# Patient Record
Sex: Female | Born: 1935 | Race: White | Hispanic: No | Marital: Married | Smoking: Former smoker
Health system: Southern US, Community
[De-identification: ages and names within clinical notes are randomized; demographics above are authoritative.]

## PROBLEM LIST (undated history)

## (undated) DIAGNOSIS — I10 Essential (primary) hypertension: Secondary | ICD-10-CM

## (undated) DIAGNOSIS — I639 Cerebral infarction, unspecified: Secondary | ICD-10-CM

## (undated) DIAGNOSIS — D329 Benign neoplasm of meninges, unspecified: Secondary | ICD-10-CM

## (undated) DIAGNOSIS — E119 Type 2 diabetes mellitus without complications: Secondary | ICD-10-CM

## (undated) DIAGNOSIS — D649 Anemia, unspecified: Secondary | ICD-10-CM

## (undated) DIAGNOSIS — E785 Hyperlipidemia, unspecified: Secondary | ICD-10-CM

## (undated) HISTORY — PX: COLONOSCOPY: SHX174

## (undated) HISTORY — PX: BREAST EXCISIONAL BIOPSY: SUR124

## (undated) HISTORY — PX: CHOLECYSTECTOMY: SHX55

## (undated) HISTORY — PX: ABDOMINAL HYSTERECTOMY: SHX81

---

## 2004-09-17 ENCOUNTER — Ambulatory Visit: Payer: Self-pay | Admitting: Internal Medicine

## 2005-02-17 ENCOUNTER — Encounter: Payer: Self-pay | Admitting: Internal Medicine

## 2005-02-25 ENCOUNTER — Encounter: Payer: Self-pay | Admitting: Internal Medicine

## 2005-08-17 ENCOUNTER — Ambulatory Visit: Payer: Self-pay | Admitting: Internal Medicine

## 2005-10-21 ENCOUNTER — Ambulatory Visit: Payer: Self-pay | Admitting: Internal Medicine

## 2006-04-25 ENCOUNTER — Ambulatory Visit: Payer: Self-pay | Admitting: Unknown Physician Specialty

## 2006-10-25 ENCOUNTER — Ambulatory Visit: Payer: Self-pay | Admitting: Internal Medicine

## 2007-11-07 ENCOUNTER — Ambulatory Visit: Payer: Self-pay | Admitting: Internal Medicine

## 2008-11-08 ENCOUNTER — Ambulatory Visit: Payer: Self-pay | Admitting: Internal Medicine

## 2009-11-17 ENCOUNTER — Ambulatory Visit: Payer: Self-pay | Admitting: Internal Medicine

## 2010-11-18 ENCOUNTER — Ambulatory Visit: Payer: Self-pay | Admitting: Internal Medicine

## 2011-04-27 ENCOUNTER — Ambulatory Visit: Payer: Self-pay | Admitting: Unknown Physician Specialty

## 2011-12-17 ENCOUNTER — Ambulatory Visit: Payer: Self-pay | Admitting: Internal Medicine

## 2012-12-18 ENCOUNTER — Ambulatory Visit: Payer: Self-pay | Admitting: Internal Medicine

## 2013-01-24 ENCOUNTER — Inpatient Hospital Stay: Payer: Self-pay | Admitting: Orthopedic Surgery

## 2013-01-24 LAB — CBC
MCHC: 33.2 g/dL (ref 32.0–36.0)
MCV: 86 fL (ref 80–100)
RBC: 4.33 10*6/uL (ref 3.80–5.20)
WBC: 13.1 10*3/uL — ABNORMAL HIGH (ref 3.6–11.0)

## 2013-01-24 LAB — URINALYSIS, COMPLETE
Bilirubin,UR: NEGATIVE
Glucose,UR: NEGATIVE mg/dL (ref 0–75)
Nitrite: NEGATIVE
Ph: 5 (ref 4.5–8.0)
Protein: NEGATIVE
WBC UR: 3 /HPF (ref 0–5)

## 2013-01-24 LAB — BASIC METABOLIC PANEL
Anion Gap: 7 (ref 7–16)
Calcium, Total: 9.7 mg/dL (ref 8.5–10.1)
Chloride: 107 mmol/L (ref 98–107)
Creatinine: 0.92 mg/dL (ref 0.60–1.30)
Glucose: 123 mg/dL — ABNORMAL HIGH (ref 65–99)
Osmolality: 287 (ref 275–301)
Potassium: 3.7 mmol/L (ref 3.5–5.1)

## 2013-01-24 LAB — APTT: Activated PTT: 29 secs (ref 23.6–35.9)

## 2013-01-24 LAB — PROTIME-INR
INR: 0.9
Prothrombin Time: 12.6 secs (ref 11.5–14.7)

## 2013-01-25 LAB — HEMOGLOBIN: HGB: 10.6 g/dL — ABNORMAL LOW (ref 12.0–16.0)

## 2013-01-26 ENCOUNTER — Encounter: Payer: Self-pay | Admitting: Internal Medicine

## 2013-01-26 LAB — BASIC METABOLIC PANEL
Anion Gap: 6 — ABNORMAL LOW (ref 7–16)
BUN: 11 mg/dL (ref 7–18)
Calcium, Total: 7.9 mg/dL — ABNORMAL LOW (ref 8.5–10.1)
Osmolality: 281 (ref 275–301)
Potassium: 3.6 mmol/L (ref 3.5–5.1)
Sodium: 140 mmol/L (ref 136–145)

## 2013-01-26 LAB — CBC WITH DIFFERENTIAL/PLATELET
Basophil #: 0 10*3/uL (ref 0.0–0.1)
Basophil %: 0.1 %
Eosinophil %: 0 %
HGB: 9.9 g/dL — ABNORMAL LOW (ref 12.0–16.0)
Lymphocyte #: 1 10*3/uL (ref 1.0–3.6)
MCH: 28.7 pg (ref 26.0–34.0)
MCHC: 33.9 g/dL (ref 32.0–36.0)
MCV: 85 fL (ref 80–100)
Platelet: 144 10*3/uL — ABNORMAL LOW (ref 150–440)
RBC: 3.46 10*6/uL — ABNORMAL LOW (ref 3.80–5.20)
WBC: 11.2 10*3/uL — ABNORMAL HIGH (ref 3.6–11.0)

## 2013-03-15 ENCOUNTER — Encounter: Payer: Self-pay | Admitting: Orthopedic Surgery

## 2013-03-27 ENCOUNTER — Encounter: Payer: Self-pay | Admitting: Orthopedic Surgery

## 2013-04-27 ENCOUNTER — Encounter: Payer: Self-pay | Admitting: Orthopedic Surgery

## 2013-05-28 ENCOUNTER — Encounter: Payer: Self-pay | Admitting: Orthopedic Surgery

## 2013-10-02 ENCOUNTER — Ambulatory Visit: Payer: Self-pay | Admitting: Orthopedic Surgery

## 2013-12-19 ENCOUNTER — Ambulatory Visit: Payer: Self-pay | Admitting: Internal Medicine

## 2014-07-03 ENCOUNTER — Encounter: Payer: Self-pay | Admitting: Neurology

## 2014-07-28 ENCOUNTER — Encounter: Payer: Self-pay | Admitting: Neurology

## 2014-12-30 ENCOUNTER — Ambulatory Visit
Admit: 2014-12-30 | Disposition: A | Discharge status: Home / Self Care | Payer: Self-pay | Attending: Internal Medicine | Admitting: Internal Medicine

## 2015-01-01 ENCOUNTER — Ambulatory Visit
Admit: 2015-01-01 | Disposition: A | Discharge status: Home / Self Care | Payer: Self-pay | Attending: Internal Medicine | Admitting: Internal Medicine

## 2015-01-17 NOTE — Discharge Summary (Signed)
PATIENT NAME:  TIERRE, GERARD MR#:  [id number] DATE OF BIRTH:  [birth year]  DATE OF ADMISSION:  01/26/2013 DATE OF DISCHARGE:  01/27/2013  ADMITTING DIAGNOSIS:  Left bimalleolar ankle fracture.  HISTORY OF PRESENT ILLNESS:  The patient is a 80 year old female who slipped on her wet front porch and fell twisting her left ankle. She was brought to the [facility] Emergency Department where she was diagnosed with a bimalleolar ankle fracture by x-ray and admitted to the orthopedic surgery service.   PAST MEDICAL HISTORY:  Includes hypertension and left ankle fracture.   ALLERGIES:  No known drug allergies.  HOME MEDICATIONS:  Include 81 mg aspirin daily, magnesium chloride extended release 250 mg once daily, Tiazac 300 mg extended release oral capsule 1 caplet daily, multivitamin daily, Evista 60 mg daily and Micardis 40 mg daily.   SOCIAL HISTORY:  The patient lives at home.   FAMILY HISTORY:  Noncontributory.   HOSPITAL COURSE: The patient was admitted on 01/25/2013. She was cleared by the hospitalist medical service for surgery. She was taken to the operating room later on 34/19/3790 for an uncomplicated open reduction and internal fixation of the left bimalleolar ankle fracture. The patient returned to the orthopedic floor postop. She had a Foley catheter in place which was removed on postop day #2.  She received 24 hours of postop antibiotics and was seen and evaluated by physical and occupational therapy on postop day #1.  Her postop CBC demonstrated stable hemoglobin and hematocrit.  Her basic metabolic profile was within normal limits. The patient's pain was well controlled. She remained on strict elevation during bed rest. She was encouraged to improve her breathing with incentive spirometry. By postop day #2, the Foley catheter was removed. Her splint remained clean, dry and intact. The patient had no medical issues and was prepared for discharge to rehab.   DISCHARGE  INSTRUCTIONS: The patient was discharged to rehab with instructions to remain touchdown weight-bearing on her left lower extremity.  She should elevate the lower extremity whenever possible. She is to stay in the splint until her follow-up and she should keep the splint dry at all times. The patient will use a walker with assistance with ambulation. She should receive physical and occupational therapy at rehab. The patient will return to my office in 10 to 14 days for wound check and x-ray. She will be converted to a cast at that time. The patient should remain on Lovenox 30 mg subcutaneous b.i.d. while at skilled nursing. When she is discharged home, she should be taking 40 mg daily of Lovenox subcutaneous for a total of 4 weeks postoperative.  DISCHARGE PHYSICAL EXAMINATION: The patient's dressing was clean, dry and intact. There is no active drainage. She could flex and extend all 5 toes and her toes were well perfused. She had decreased sensation in the left toes which is her baseline, but she had normal sensation on the dorsum of her foot and the first dorsal web space.    ____________________________ Timoteo Gaul, MD klk:ce D: 01/26/2013 17:04:49 ET T: 01/26/2013 17:26:43 ET JOB#: [id number]  cc: Timoteo Gaul, MD, <Dictator>

## 2015-01-17 NOTE — Op Note (Signed)
PATIENT NAME:  Natasha Bentley, GROSS MR#:  [id number] DATE OF BIRTH:  Jul 27, 1935  DATE OF SURGERY:  01/25/2013  PREOPERATIVE DIAGNOSIS: Left bimalleolar ankle fracture.   POSTOPERATIVE DIAGNOSIS: Left bimalleolar ankle fracture.   PROCEDURE: Open reduction, internal fixation, left bimalleolar ankle fracture.   ANESTHESIA: General with local, with 0.25% Marcaine without epi.   SURGEON: Thornton Park, M.D.   ESTIMATED BLOOD LOSS: 25 mL.   COMPLICATIONS: None.   TOURNIQUET TIME: 90 minutes.   IMPLANTS: Synthes 8-hole, one-third tubular plate and two 4.0 cannulated screws.   HISTORY: Ms Axelson is a 80 year old female who sustained a mechanical fall at home. She was unable to stand or bear weight following the injury. X-rays taken at the [facility] Emergency Department diagnosed her with a bimalleolar ankle fracture. The patient was seen by the hospitalist service and cleared for surgery. I saw and evaluated the patient and recommended open reduction and internal fixation. I explained the risks and benefits of surgery to her. The patient wished to proceed with surgery.   PROCEDURE NOTE: The patient was marked with the word "yes" over the left leg within the operative field according to the hospital's right-side protocol. She was brought to the operating room where she underwent general anesthesia. She was prepped and draped in sterile fashion.   A time-out was performed to verify the patient's name, date of birth, medical record number, correct side of surgery, and correct procedure to be performed. It was also used to verify the patient had received antibiotics and that all appropriate instruments, implants, and radiographic studies were available in the room. Once all in attendance were in agreement, the case began.   FluoroScan images were initially taken of the fracture to help for planning of the incisions. The lateral incision along the fibula was then made with a #10 blade  centered over the fracture. The soft tissues were carefully dissected with Metzenbaum scissors and a pick-up. Care was taken to avoid the superficial peroneal nerve. The fracture was then identified. The fracture hematoma was irrigated. The overlying soft tissue was cleared from the fracture site with a Key elevator. The fracture was then manually reduced and held into place with a fracture clamp. A single  2.5 mm bicortical lag screw was then placed across the fracture site. This screw was 20 mm in length. Next, an 8-hole one-third tubular Synthes plate was then contoured to the lateral malleolus. This was then fixed to the fibula using proximal 3.5 mm bicortical screws and distally fixed the lateral malleolus with fully-threaded cancellous screws. Once the fibula was fixed the attention was turned to the medial side.   A vertical incision was made over the medial malleolus. Carefully again, the soft tissues were dissected with Metzenbaum scissors and a pick-up. The fracture was identified and cleaned, and irrigated of fracture hematoma. They joint was inspected and showed no evidence of chondral injury. The fracture was reduced and held into place with a dental pick. Two threaded K-wires were then advanced across the fracture site and into the distal tibia. The positions of these were confirmed with FluoroScan imaging. They were measured for depth and then overdrilled.   One 4-0 cannulated screw x 50 millimeters in length, and one 48 mm in length were then manually advanced into position. These compressed the medial malleolus fracture well. Both incisions were then copiously irrigated. Final arthroscopic images were taken. Stress views of the ankle were also performed to ensure that there was no instability of syndesmosis. The  subcutaneous tissue of both incisions were closed with a 2-0 Vicryl and the skin approximated with staples. A dry sterile dressing and an AO splint were then applied. The patient was  then awakened and brought to the PACU in stable condition.   I was scrubbed and present for the entire case, and all sharp and instrument counts were correct at the conclusion of the case.   I spoke with the patient's family postoperatively to let them know the patient was stable in the recovery room and that the case had gone without complication.    ____________________________ Timoteo Gaul, MD klk:dm D: 02/01/2013 09:33:00 ET T: 02/01/2013 09:47:39 ET JOB#: [id number]  cc: Timoteo Gaul, MD, <Dictator> Timoteo Gaul MD ELECTRONICALLY SIGNED 02/02/2013 17:08

## 2015-01-17 NOTE — H&P (Signed)
Subjective/Chief Complaint Left ankle pain   History of Present Illness Patient is a 80 year old female who slipped on her wet porch last night and fell.  She twisted her left ankle and had her left leg flex underneath her during the fall.  She denies head injury or any other sources of pain.  Her family is at the bedside this AM.  Patient was seen and examined at 9:30AM.   Past Med/Surgical Hx:  FX  LEFT ANKLE:   Hypertension:   ALLERGIES:  No Known Allergies:   HOME MEDICATIONS: Medication Instructions Status  aspirin 81 mg oral delayed release tablet 1 tab(s) orally once a day Active  magnesium chloride extended release 250 milligram(s)  once a day Active  Tiazac 300 mg/24 hours oral capsule, extended release 1 cap(s) orally once a day Active  Vitamins Multiple Vitamins oral tablet 1 tab(s) orally once a day Active  Evista 60 mg oral tablet 1 tab(s) orally once a day Active  Micardis 40 mg oral tablet 1 tab(s) orally once a day Active   Family and Social History:  Family History Non-Contributory   Place of Living Home   Review of Systems:  Subjective/Chief Complaint Left ankle pain   Physical Exam:  GEN no acute distress   HEENT PERRL, hearing intact to voice, moist oral mucosa, Oropharynx clear   NECK supple  No masses  trachea midline   RESP normal resp effort  clear BS  no use of accessory muscles   CARD regular rate  no murmur  No LE edema  no JVD   ABD denies tenderness  soft  normal BS  no Adominal Mass   GU foley catheter in place  clear yellow urine draining   LYMPH negative neck   EXTR Left leg in splint.  Toes are well perfused.  She has intact sensation in the left lower extremity and can flex and extend all toes.   SKIN normal to palpation   NEURO motor/sensory function intact   PSYCH A+O to time, place, person   Lab Results: Routine Chem:  30-Apr-14 22:14   Glucose, Serum  123  Creatinine (comp) 0.92  Sodium, Serum 142  Potassium, Serum  3.7  Chloride, Serum 107  CO2, Serum 28  Calcium (Total), Serum 9.7  Anion Gap 7  Osmolality (calc) 287  eGFR (African American) >60  eGFR (Non-African American) >60 (eGFR values <38m/min/1.73 m2 may be an indication of chronic kidney disease (CKD). Calculated eGFR is useful in patients with stable renal function. The eGFR calculation will not be reliable in acutely ill patients when serum creatinine is changing rapidly. It is not useful in  patients on dialysis. The eGFR calculation may not be applicable to patients at the low and high extremes of body sizes, pregnant women, and vegetarians.)  Routine UA:  30-Apr-14 22:14   Color (UA) Yellow  Clarity (UA) Clear  Glucose (UA) Negative  Bilirubin (UA) Negative  Ketones (UA) Negative  Specific Gravity (UA) 1.020  Blood (UA) Negative  pH (UA) 5.0  Protein (UA) Negative  Nitrite (UA) Negative  Leukocyte Esterase (UA) Trace (Result(s) reported on 24 Jan 2013 at 11:25PM.)  RBC (UA) 2 /HPF  WBC (UA) 3 /HPF  Bacteria (UA) TRACE  Epithelial Cells (UA) 1 /HPF  Mucous (UA) PRESENT (Result(s) reported on 24 Jan 2013 at 11:25PM.)  Routine Coag:  30-Apr-14 22:14   Prothrombin 12.6  INR 0.9 (INR reference interval applies to patients on anticoagulant therapy. A single INR  therapeutic range for coumarins is not optimal for all indications; however, the suggested range for most indications is 2.0 - 3.0. Exceptions to the INR Reference Range may include: Prosthetic heart valves, acute myocardial infarction, prevention of myocardial infarction, and combinations of aspirin and anticoagulant. The need for a higher or lower target INR must be assessed individually. Reference: The Pharmacology and Management of the Vitamin K  antagonists: the seventh ACCP Conference on Antithrombotic and Thrombolytic Therapy. WRUEA.5409 Sept:126 (3suppl): [id number]. A HCT value >55% may artifactually increase the PT.  In one study,  the increase was an  average of 25%. Reference:  "Effect on Routine and Special Coagulation Testing Values of Citrate Anticoagulant Adjustment in Patients with High HCT Values." American Journal of Clinical Pathology 2006;126:400-405.)  Activated PTT (APTT) 29.0 (A HCT value >55% may artifactually increase the APTT. In one study, the increase was an average of 19%. Reference: "Effect on Routine and Special Coagulation Testing Values of Citrate Anticoagulant Adjustment in Patients with High HCT Values." American Journal of Clinical Pathology 2006;126:400-405.)  Routine Hem:  30-Apr-14 22:14   WBC (CBC)  13.1  RBC (CBC) 4.33  Hemoglobin (CBC) 12.3  Hematocrit (CBC) 37.2  Platelet Count (CBC) 182 (Result(s) reported on 24 Jan 2013 at 10:25PM.)  MCV 86  MCH 28.5  MCHC 33.2  RDW  15.0   Radiology Results: XRay:    30-Apr-14 21:07, Ankle Left Complete  Ankle Left Complete  REASON FOR EXAM:    injury  COMMENTS:       PROCEDURE: DXR - DXR ANKLE LEFT COMPLETE  - Jan 24 2013  9:07PM     RESULT: The patient has sustained a bimalleolar fracture. There is   disruption of the ankle joint mortise. The distal fibular fracture is  moderately angulated. The medial malleolar fracture which is horizontally   oriented is mildly displaced. The posterior malleolus appears intact.   There is a plantar calcaneal spur. The talus and calcaneus appear intact.   The metatarsal bases also appear intact.    IMPRESSION:  The patient has sustained an acute displaced bimalleolar   fracture with disruption of the ankle joint mortise.   Dictation Site: 5        Verified By: DAVID A. Martinique, M.D., MD    30-Apr-14 22:25, Chest 1 View AP or PA  Chest 1 View AP or PA  REASON FOR EXAM:    preop clearance  COMMENTS:       PROCEDURE: DXR - DXR CHEST 1 VIEWAP OR PA  - Jan 24 2013 10:25PM     RESULT: The lungs are adequately inflated. There is no focal infiltrate.   The cardiac silhouette is normal in size. The pulmonary  vascularity is   not engorged. The mediastinum is normal in width. There is no pleural   effusion.    IMPRESSION:  There is no evidence of acute cardiopulmonary abnormality.     Dictation Site: 5      Verified By: DAVID A. Martinique, M.D., MD    Assessment/Admission Diagnosis Left bimalleolar ankle fracture.   Plan I discussed the diagnosis of a bimalleolar fracture with the patient and her family.  I drew on the white board in her room to diagram the injury and proposed surgical treatment.  Ihave recommended open reduction and internal fixation.  The risks and benefits of surgical intervention were discussed in detail with the patient. The patient expressed understanding of the risks and benefits and agreed with plans for surgery. The risks  include, but are not limited to: infection, bleeding, nerve and blood vessel injury (especially the superficial peroneal nerve leading to dorsal foot numbness), ankle stiffness, malunion, nonunion, hardware failure, painful hardware, osteoarthritis, need for more surgery, DVT, and PE, MI, stroke, pneumonia, respiratory failure, and death.  Patient has been cleared for surgery by the hospitalist.  I have reviewed all her radiographs and labs.  I answered the patient's and family's questions.  Surgery is scheduled for later today.   Electronic Signatures: Thornton Park (MD)  (Signed 01-May-14 11:45)  Authored: CHIEF COMPLAINT and HISTORY, PAST MEDICAL/SURGIAL HISTORY, ALLERGIES, HOME MEDICATIONS, FAMILY AND SOCIAL HISTORY, REVIEW OF SYSTEMS, PHYSICAL EXAM, LABS, Radiology, ASSESSMENT AND PLAN   Last Updated: 01-May-14 11:45 by Thornton Park (MD)

## 2015-01-17 NOTE — Discharge Summary (Signed)
PATIENT NAME:  Natasha Bentley, BOEDING MR#:  [id number] DATE OF BIRTH:  08-14-1935  DATE OF ADMISSION:  01/24/2013 DATE OF DISCHARGE:  01/27/2013  ADMITTING DIAGNOSIS:  Left bimalleolar ankle fracture.  HISTORY OF PRESENT ILLNESS:  The patient is a 80 year old female who slipped on her wet front porch and fell twisting her left ankle. She was brought to the [facility] Emergency Department where she was diagnosed with a bimalleolar ankle fracture by x-ray and admitted to the orthopedic surgery service.   PAST MEDICAL HISTORY:  Includes hypertension and left ankle fracture.   ALLERGIES:  No known drug allergies.  HOME MEDICATIONS:  Include 81 mg aspirin daily, magnesium chloride extended release 250 mg once daily, Tiazac 300 mg extended release oral capsule 1 caplet daily, multivitamin daily, Evista 60 mg daily and Micardis 40 mg daily.   SOCIAL HISTORY:  The patient lives at home.   FAMILY HISTORY:  Noncontributory.   HOSPITAL COURSE: The patient was admitted on 01/25/2013. She was cleared by the hospitalist medical service for surgery. She was taken to the operating room later on 15/17/6160 for an uncomplicated open reduction and internal fixation of the left bimalleolar ankle fracture. The patient returned to the orthopedic floor postop. She had a Foley catheter in place which was removed on postop day #2.  She received 24 hours of postop antibiotics and was seen and evaluated by physical and occupational therapy on postop day #1.  Her postop CBC demonstrated stable hemoglobin and hematocrit.  Her basic metabolic profile was within normal limits. The patient's pain was well controlled. She remained on strict elevation during bed rest. She was encouraged to improve her breathing with incentive spirometry. By postop day #2, the Foley catheter was removed. Her splint remained clean, dry and intact. The patient had no medical issues and was prepared for discharge to rehab.   DISCHARGE  INSTRUCTIONS: The patient was discharged to rehab with instructions to remain touchdown weight-bearing on her left lower extremity.  She should elevate the lower extremity whenever possible. She is to stay in the splint until her follow-up and she should keep the splint dry at all times. The patient will use a walker with assistance with ambulation. She should receive physical and occupational therapy at rehab. The patient will return to my office in 10 to 14 days for wound check and x-ray. She will be converted to a cast at that time. The patient should remain on Lovenox 30 mg subcutaneous b.i.d. while at skilled nursing. When she is discharged home, she should be taking 40 mg daily of Lovenox subcutaneous for a total of 4 weeks postoperative.  DISCHARGE PHYSICAL EXAMINATION: The patient's dressing was clean, dry and intact. There is no active drainage. She could flex and extend all 5 toes and her toes were well perfused. She had decreased sensation in the left toes which is her baseline, but she had normal sensation on the dorsum of her foot and the first dorsal web space.    ____________________________ Timoteo Gaul, MD klk:ce D: 01/26/2013 17:04:00 ET T: 01/26/2013 17:26:43 ET JOB#: [id number]  cc: Timoteo Gaul, MD, <Dictator> Timoteo Gaul MD ELECTRONICALLY SIGNED 02/01/2013 9:30

## 2015-01-17 NOTE — Consult Note (Signed)
PATIENT NAME:  Natasha Bentley, Natasha Bentley MR#:  [id number] DATE OF BIRTH:  08-13-1935  DATE OF CONSULTATION:  01/25/2013  REFERRING PHYSICIAN:  Dr. Thornton Park CONSULTING PHYSICIAN:  Monica Becton, MD  REASON FOR CONSULTATION:  Medical clearance as well as medical management.   PRIMARY CARE PHYSICIAN:  Dr. Emily Filbert.   HISTORY OF PRESENT ILLNESS:  The patient is a 80 year old pleasant white female with a past medical history of hypertension, previous history of meningioma, was getting down from the staircase slipped and fell down.  The patient could not stand up.  Work-up in the Emergency Department showed left displaced bimalleolar fracture with a disruption of the ankle joint mortise.  The patient is admitted under Dr. Mack Guise.  Hospitalist service has been consulted for medical clearance as well as medical management.  The patient denies any loss of consciousness.  The patient does not get any chest pain, shortness of breath with walking.  The patient at baseline is quite active, independent of ADLs and IADLs.  The patient did not have any previous work-up in regards to heart.  Denies having any lower extremity swelling.   PAST MEDICAL HISTORY: 1.  Hypertension.  2.  History of TIAs.   PAST SURGICAL HISTORY:  Brain surgery for the meningioma.   ALLERGIES:  No known drug allergies.   HOME MEDICATIONS: 1.  Multivitamin 1 tablet daily.  2.   300 mg 1 capsule once a day.  3.  Micardis 40 mg once a day.  4.  Magnesium chloride 250 mg once a day.  5.  Evista 60 mg once a day.  6.  Aspirin 81 mg once a day.   SOCIAL HISTORY:  Smoked in the past, quit a few years back.  Denies drinking alcohol or using illicit drugs.  Married, lives with her husband.   FAMILY HISTORY:  Mother lived to be 52.  No obvious health problems run in the family.   REVIEW OF SYSTEMS:   CONSTITUTIONAL:  Denies any generalized fatigue, weakness.  EYES:  No change in vision.  No redness.  EARS, NOSE, THROAT:   No decreased hearing, sore throat.  RESPIRATORY:  No cough, shortness of breath.  CARDIOVASCULAR:  No chest pain, palpitations.  GASTROINTESTINAL:  Has a good appetite, regular bowel movements.  GENITOURINARY:  No dysuria or hematuria.  SKIN:  No rash or lesions.  HEMATOLOGIC:  No easy bruising, bleeding.  MUSCULOSKELETAL:  No joint pains and aches or swelling.  ENDOCRINE:  No polyuria or polydipsia.  NEUROLOGIC:  No weakness or numbness in any part of the body.   PHYSICAL EXAMINATION: GENERAL:  This is a well-built, well-nourished, age-appropriate female lying down in the bed, not in distress.  VITAL SIGNS:  Temperature 98.2, pulse 86, blood pressure 163/58, respiratory rate of 20, oxygen saturations 96% on room air.  HEENT:  Head normocephalic, atraumatic.  Eyes, no sclerae icterus.  Conjunctivae normal.  Pupils equal and react to light.  Mucous membranes moist.  No pharyngeal erythema.  Extraocular movements are intact.  NECK:  Supple.  No lymphadenopathy.  No JVD.  No carotid bruit.  No thyromegaly.  CHEST:  Has no focal tenderness.  LUNGS:  Bilateral clear to auscultation.  Good air movement in both lungs.  HEART:  S1, S2 regular.  No murmurs are heard.  No pedal edema.  Pulses plus in dorsalis pedis.  ABDOMEN:  Bowel sounds plus.  Soft, nontender, nondistended.  No hepatosplenomegaly.  SKIN:  No rash or lesions.   LYMPHATIC:  No cervical, axillary or inguinal lymphadenopathy.  NEUROLOGIC:  The patient is alert, oriented to place, person and time.  Cranial nerves II through XII intact.  Motor 5 by 5 in upper and lower extremities, limited secondary to pain in the left lower extremity.   LABORATORY DATA:  CMP is completely within normal limits.  CBC, WBC of 13, hemoglobin 12.3, platelet count of 182.  Coag profile is completely within normal limits.  UA trace leukocyte esterase.  Chest x-ray, one view, portable, no evidence of acute cardiopulmonary disease.   EKG, 12-lead, normal sinus  rhythm with no ST-T wave abnormalities.   ASSESSMENT AND PLAN:  The patient is a 80 year old female who comes to the Emergency Department after having a mechanical fall, sustained left bimalleolar fracture, displaced.  1.  Displaced bimalleolar fracture with a  of the joint.  The patient will be undergoing surgery by Dr. Mack Guise.  Based on revised cardiac index score, the patient has 0.4% risk of intraoperative risk which is low.  At baseline, the patient is well-functional, active.  Continue with incentive spirometer.  Discontinue the Foley catheter at the earliest.  The patient is cleared for this surgery.  2.  Hypertension, currently moderately-controlled.  This could be secondary to pain.  We will continue to watch.  3.  Keep the patient on deep vein thrombosis prophylaxis with Lovenox.     ____________________________ Monica Becton, MD pv:ea D: 01/25/2013 02:45:12 ET T: 01/25/2013 04:16:49 ET JOB#: [id number]  cc: Monica Becton, MD, <Dictator> [name], MD Timoteo Gaul, MD Monica Becton MD ELECTRONICALLY SIGNED 01/29/2013 6:57

## 2015-02-10 ENCOUNTER — Other Ambulatory Visit
Admit: 2015-02-10 | Discharge: 2015-02-10 | Disposition: A | Discharge status: Home / Self Care | Payer: Medicare Other | Source: Ambulatory Visit | Attending: Unknown Physician Specialty | Admitting: Unknown Physician Specialty

## 2015-02-10 DIAGNOSIS — R197 Diarrhea, unspecified: Secondary | ICD-10-CM | POA: Insufficient documentation

## 2015-02-10 LAB — C DIFFICILE QUICK SCREEN W PCR REFLEX
C DIFFICILE (CDIFF) INTERP: NEGATIVE
C Diff antigen: NEGATIVE
C Diff toxin: NEGATIVE

## 2015-02-12 LAB — WBCS, STOOL: WBCS, STOOL: NONE SEEN

## 2015-02-13 LAB — STOOL CULTURE

## 2015-11-24 ENCOUNTER — Other Ambulatory Visit: Payer: Self-pay | Admitting: Internal Medicine

## 2015-11-24 DIAGNOSIS — Z1231 Encounter for screening mammogram for malignant neoplasm of breast: Secondary | ICD-10-CM

## 2015-11-28 ENCOUNTER — Other Ambulatory Visit: Payer: Self-pay | Admitting: Physician Assistant

## 2015-11-28 DIAGNOSIS — K219 Gastro-esophageal reflux disease without esophagitis: Secondary | ICD-10-CM

## 2015-11-28 DIAGNOSIS — R1319 Other dysphagia: Secondary | ICD-10-CM

## 2015-12-03 ENCOUNTER — Ambulatory Visit
Admission: RE | Admit: 2015-12-03 | Discharge: 2015-12-03 | Disposition: A | Discharge status: Home / Self Care | Payer: Medicare Other | Source: Ambulatory Visit | Attending: Physician Assistant | Admitting: Physician Assistant

## 2015-12-03 DIAGNOSIS — K219 Gastro-esophageal reflux disease without esophagitis: Secondary | ICD-10-CM | POA: Insufficient documentation

## 2015-12-03 DIAGNOSIS — K449 Diaphragmatic hernia without obstruction or gangrene: Secondary | ICD-10-CM | POA: Insufficient documentation

## 2015-12-03 DIAGNOSIS — R1319 Other dysphagia: Secondary | ICD-10-CM | POA: Insufficient documentation

## 2015-12-24 ENCOUNTER — Encounter: Payer: Self-pay | Admitting: *Deleted

## 2015-12-25 ENCOUNTER — Encounter: Payer: Self-pay | Admitting: *Deleted

## 2015-12-25 ENCOUNTER — Encounter
Admission: RE | Disposition: A | Discharge status: Home / Self Care | Payer: Self-pay | Source: Ambulatory Visit | Attending: Unknown Physician Specialty

## 2015-12-25 ENCOUNTER — Ambulatory Visit: Payer: Medicare Other | Admitting: Anesthesiology

## 2015-12-25 ENCOUNTER — Ambulatory Visit
Admission: RE | Admit: 2015-12-25 | Discharge: 2015-12-25 | Disposition: A | Discharge status: Home / Self Care | Payer: Medicare Other | Source: Ambulatory Visit | Attending: Unknown Physician Specialty | Admitting: Unknown Physician Specialty

## 2015-12-25 DIAGNOSIS — I10 Essential (primary) hypertension: Secondary | ICD-10-CM | POA: Insufficient documentation

## 2015-12-25 DIAGNOSIS — Z79899 Other long term (current) drug therapy: Secondary | ICD-10-CM | POA: Insufficient documentation

## 2015-12-25 DIAGNOSIS — K5730 Diverticulosis of large intestine without perforation or abscess without bleeding: Secondary | ICD-10-CM | POA: Diagnosis not present

## 2015-12-25 DIAGNOSIS — E119 Type 2 diabetes mellitus without complications: Secondary | ICD-10-CM | POA: Insufficient documentation

## 2015-12-25 DIAGNOSIS — Z8673 Personal history of transient ischemic attack (TIA), and cerebral infarction without residual deficits: Secondary | ICD-10-CM | POA: Diagnosis not present

## 2015-12-25 DIAGNOSIS — Z86010 Personal history of colonic polyps: Secondary | ICD-10-CM | POA: Insufficient documentation

## 2015-12-25 DIAGNOSIS — K640 First degree hemorrhoids: Secondary | ICD-10-CM | POA: Diagnosis not present

## 2015-12-25 DIAGNOSIS — Z7982 Long term (current) use of aspirin: Secondary | ICD-10-CM | POA: Insufficient documentation

## 2015-12-25 DIAGNOSIS — Z87891 Personal history of nicotine dependence: Secondary | ICD-10-CM | POA: Diagnosis not present

## 2015-12-25 DIAGNOSIS — Z1211 Encounter for screening for malignant neoplasm of colon: Secondary | ICD-10-CM | POA: Insufficient documentation

## 2015-12-25 DIAGNOSIS — E785 Hyperlipidemia, unspecified: Secondary | ICD-10-CM | POA: Diagnosis not present

## 2015-12-25 HISTORY — DX: Hyperlipidemia, unspecified: E78.5

## 2015-12-25 HISTORY — DX: Type 2 diabetes mellitus without complications: E11.9

## 2015-12-25 HISTORY — DX: Cerebral infarction, unspecified: I63.9

## 2015-12-25 HISTORY — DX: Anemia, unspecified: D64.9

## 2015-12-25 HISTORY — DX: Benign neoplasm of meninges, unspecified: D32.9

## 2015-12-25 HISTORY — DX: Essential (primary) hypertension: I10

## 2015-12-25 HISTORY — PX: COLONOSCOPY WITH PROPOFOL: SHX5780

## 2015-12-25 SURGERY — COLONOSCOPY WITH PROPOFOL
Anesthesia: General

## 2015-12-25 MED ORDER — FENTANYL CITRATE (PF) 100 MCG/2ML IJ SOLN
INTRAMUSCULAR | Status: DC | PRN
  Administered 2015-12-25: 50 ug via INTRAVENOUS

## 2015-12-25 MED ORDER — MIDAZOLAM HCL 2 MG/2ML IJ SOLN
INTRAMUSCULAR | Status: DC | PRN
  Administered 2015-12-25: 1 mg via INTRAVENOUS

## 2015-12-25 MED ORDER — PROPOFOL 500 MG/50ML IV EMUL
INTRAVENOUS | Status: DC | PRN
  Administered 2015-12-25: 100 ug/kg/min via INTRAVENOUS

## 2015-12-25 MED ORDER — SODIUM CHLORIDE 0.9 % IV SOLN: INTRAVENOUS | Status: DC

## 2015-12-25 MED ORDER — SODIUM CHLORIDE 0.9 % IV SOLN
INTRAVENOUS | Status: DC
  Administered 2015-12-25: 08:00:00 via INTRAVENOUS

## 2015-12-25 NOTE — H&P (Signed)
   Primary Care Physician:  [name], MD Primary Gastroenterologist:  Dr. Vira Agar  Pre-Procedure History & Physical: HPI:  Natasha Bentley is a 81 y.o. female is here for an colonoscopy.   Past Medical History  Diagnosis Date  . Anemia   . Hypertension   . Meningioma ([place])   . Hyperlipidemia   . CVA (cerebral infarction)   . Diabetes mellitus without complication [facility])     Past Surgical History  Procedure Laterality Date  . Cholecystectomy    . Abdominal hysterectomy    . Colonoscopy      Prior to Admission medications   Medication Sig Start Date End Date Taking? Authorizing Provider  aspirin 81 MG tablet Take 81 mg by mouth daily.   Yes Historical Provider, MD  diltiazem (TIAZAC) 300 MG 24 hr capsule Take 300 mg by mouth daily.   Yes Historical Provider, MD  gabapentin (NEURONTIN) 300 MG capsule Take 300 mg by mouth at bedtime.   Yes Historical Provider, MD  hydrochlorothiazide (HYDRODIURIL) 12.5 MG tablet Take 12.5 mg by mouth daily.   Yes Historical Provider, MD  magnesium 30 MG tablet Take 250 mg by mouth 2 (two) times daily.   Yes Historical Provider, MD  meloxicam (MOBIC) 7.5 MG tablet Take 7.5 mg by mouth daily.   Yes Historical Provider, MD  Multiple Vitamin (MULTIVITAMIN) tablet Take 1 tablet by mouth daily.   Yes Historical Provider, MD  pantoprazole (PROTONIX) 40 MG tablet Take 40 mg by mouth 2 (two) times daily.   Yes Historical Provider, MD  raloxifene (EVISTA) 60 MG tablet Take 60 mg by mouth daily.   Yes Historical Provider, MD    Allergies as of 12/09/2015  . (Not on File)    History reviewed. No pertinent family history.  Social History   Social History  . Marital Status: Married    Spouse Name: N/A  . Number of Children: N/A  . Years of Education: N/A   Occupational History  . Not on file.   Social History Main Topics  . Smoking status: Former Research scientist (life sciences)  . Smokeless tobacco: Never Used  . Alcohol Use: No  . Drug Use: No  . Sexual  Activity: Not on file   Other Topics Concern  . Not on file   Social History Narrative    Review of Systems: See HPI, otherwise negative ROS  Physical Exam: BP 133/91 mmHg  Pulse 77  Temp(Src) 97.8 F (36.6 C) (Tympanic)  Resp 16  Ht 5\' 10"  (1.778 m)  Wt 95.255 kg (210 lb)  BMI 30.13 kg/m2  SpO2 98% General:   Alert,  pleasant and cooperative in NAD Head:  Normocephalic and atraumatic. Neck:  Supple; no masses or thyromegaly. Lungs:  Clear throughout to auscultation.    Heart:  Regular rate and rhythm. Abdomen:  Soft, nontender and nondistended. Normal bowel sounds, without guarding, and without rebound.   Neurologic:  Alert and  oriented x4;  grossly normal neurologically.  Impression/Plan: Normie Birks Esguerra is here for an colonoscopy to be performed for [facility] colon polyps  Risks, benefits, limitations, and alternatives regarding  colonoscopy have been reviewed with the patient.  Questions have been answered.  All parties agreeable.   Gaylyn Cheers, MD  12/25/2015, 8:18 AM

## 2015-12-25 NOTE — Transfer of Care (Signed)
Immediate Anesthesia Transfer of Care Note  Patient: Natasha Bentley  Procedure(s) Performed: Procedure(s): COLONOSCOPY WITH PROPOFOL (N/A)  Patient Location: PACU  Anesthesia Type:General  Level of Consciousness: awake and sedated  Airway & Oxygen Therapy: Patient Spontanous Breathing and Patient connected to nasal cannula oxygen  Post-op Assessment: Report given to RN and Post -op Vital signs reviewed and stable  Post vital signs: Reviewed and stable  Last Vitals:  Filed Vitals:   12/25/15 0742 12/25/15 0848  BP: 133/91 87/40  Pulse: 77 58  Temp: 36.6 C 36.6 C  Resp: 16 16    Complications: No apparent anesthesia complications

## 2015-12-25 NOTE — Anesthesia Preprocedure Evaluation (Signed)
Anesthesia Evaluation  Patient identified by MRN, date of birth, ID band Patient awake    Reviewed: Allergy & Precautions, NPO status , Patient's Chart, lab work & pertinent test results  History of Anesthesia Complications Negative for: history of anesthetic complications  Airway Mallampati: II       Dental  (+) Partial Upper   Pulmonary neg pulmonary ROS, former smoker,           Cardiovascular hypertension, Pt. on medications      Neuro/Psych TIA (left sided weakness, resolved)negative neurological ROS     GI/Hepatic Neg liver ROS,   Endo/Other  diabetes (pt denies)  Renal/GU negative Renal ROS     Musculoskeletal   Abdominal   Peds  Hematology  (+) anemia ,   Anesthesia Other Findings   Reproductive/Obstetrics                             Anesthesia Physical Anesthesia Plan  ASA: III  Anesthesia Plan: General   Post-op Pain Management:    Induction: Intravenous  Airway Management Planned: Nasal Cannula  Additional Equipment:   Intra-op Plan:   Post-operative Plan:   Informed Consent: I have reviewed the patients History and Physical, chart, labs and discussed the procedure including the risks, benefits and alternatives for the proposed anesthesia with the patient or authorized representative who has indicated his/her understanding and acceptance.     Plan Discussed with:   Anesthesia Plan Comments:         Anesthesia Quick Evaluation

## 2015-12-25 NOTE — Op Note (Signed)
[facility] Gastroenterology Patient Name: Natasha Bentley Procedure Date: 12/25/2015 8:22 AM MRN: [id number] Account #: [id number] Date of Birth: 30-Sep-1934 Admit Type: Outpatient Age: 81 Room: [facility] ENDO ROOM 4 Gender: Female Note Status: Finalized Procedure:            Colonoscopy Indications:          High risk colon cancer surveillance: Personal history                        of colonic polyps Providers:            Manya Silvas, MD Referring MD:         [name], MD (Referring MD) Medicines:            Propofol per Anesthesia Complications:        No immediate complications. Procedure:            Pre-Anesthesia Assessment:                       - After reviewing the risks and benefits, the patient                        was deemed in satisfactory condition to undergo the                        procedure.                       After obtaining informed consent, the colonoscope was                        passed under direct vision. Throughout the procedure,                        the patient's blood pressure, pulse, and oxygen                        saturations were monitored continuously. The                        Colonoscope was introduced through the anus and                        advanced to the the cecum, identified by appendiceal                        orifice and ileocecal valve. The colonoscopy was                        performed without difficulty. The patient tolerated the                        procedure well. The quality of the bowel preparation                        was excellent. Findings:      A few small-mouthed diverticula were found in the sigmoid colon.      Internal hemorrhoids were found during endoscopy. The hemorrhoids were       small and Grade I (internal hemorrhoids that do not prolapse).      The exam was  otherwise without abnormality. Impression:           - Diverticulosis in the sigmoid colon.                       -  Internal hemorrhoids.                       - The examination was otherwise normal.                       - No specimens collected. Recommendation:       Due to age no repeat colonoscopy needed.                       - The findings and recommendations were discussed with                        the patient. Manya Silvas, MD 12/25/2015 8:48:45 AM This report has been signed electronically. Number of Addenda: 0 Note Initiated On: 12/25/2015 8:22 AM Scope Withdrawal Time: 0 hours 9 minutes 27 seconds  Total Procedure Duration: 0 hours 19 minutes 19 seconds       [facility]

## 2015-12-25 NOTE — Anesthesia Procedure Notes (Signed)
Performed by: COOK-MARTIN, Tiaunna Buford Pre-anesthesia Checklist: Patient identified, Emergency Drugs available, Suction available, Patient being monitored and Timeout performed Patient Re-evaluated:Patient Re-evaluated prior to inductionOxygen Delivery Method: Nasal cannula Preoxygenation: Pre-oxygenation with 100% oxygen Intubation Type: IV induction Placement Confirmation: positive ETCO2 and CO2 detector       

## 2015-12-25 NOTE — Anesthesia Postprocedure Evaluation (Signed)
Anesthesia Post Note  Patient: Natasha Bentley  Procedure(s) Performed: Procedure(s) (LRB): COLONOSCOPY WITH PROPOFOL (N/A)  Patient location during evaluation: Endoscopy Anesthesia Type: General Level of consciousness: awake and alert Pain management: pain level controlled Vital Signs Assessment: post-procedure vital signs reviewed and stable Respiratory status: spontaneous breathing and respiratory function stable Cardiovascular status: stable Anesthetic complications: no    Last Vitals:  Filed Vitals:   12/25/15 0900 12/25/15 0910  BP: 102/57 118/63  Pulse: 63 77  Temp:    Resp: 13 16    Last Pain: There were no vitals filed for this visit.               Jayni Prescher K

## 2015-12-26 ENCOUNTER — Encounter: Payer: Self-pay | Admitting: Unknown Physician Specialty

## 2016-01-12 ENCOUNTER — Other Ambulatory Visit: Payer: Self-pay | Admitting: Internal Medicine

## 2016-01-12 ENCOUNTER — Ambulatory Visit
Admission: RE | Admit: 2016-01-12 | Discharge: 2016-01-12 | Disposition: A | Discharge status: Home / Self Care | Payer: Medicare Other | Source: Ambulatory Visit | Attending: Internal Medicine | Admitting: Internal Medicine

## 2016-01-12 DIAGNOSIS — Z1231 Encounter for screening mammogram for malignant neoplasm of breast: Secondary | ICD-10-CM

## 2016-12-07 ENCOUNTER — Other Ambulatory Visit: Payer: Self-pay | Admitting: Internal Medicine

## 2016-12-07 DIAGNOSIS — Z1231 Encounter for screening mammogram for malignant neoplasm of breast: Secondary | ICD-10-CM

## 2017-01-18 ENCOUNTER — Ambulatory Visit
Admission: RE | Admit: 2017-01-18 | Discharge: 2017-01-18 | Disposition: A | Discharge status: Home / Self Care | Payer: Medicare Other | Source: Ambulatory Visit | Attending: Internal Medicine | Admitting: Internal Medicine

## 2017-01-18 DIAGNOSIS — Z1231 Encounter for screening mammogram for malignant neoplasm of breast: Secondary | ICD-10-CM | POA: Diagnosis present

## 2017-12-19 ENCOUNTER — Other Ambulatory Visit: Payer: Self-pay | Admitting: Internal Medicine

## 2017-12-19 DIAGNOSIS — Z1231 Encounter for screening mammogram for malignant neoplasm of breast: Secondary | ICD-10-CM

## 2018-02-07 ENCOUNTER — Ambulatory Visit
Admission: RE | Admit: 2018-02-07 | Discharge: 2018-02-07 | Disposition: A | Discharge status: Home / Self Care | Payer: Medicare Other | Source: Ambulatory Visit | Attending: Internal Medicine | Admitting: Internal Medicine

## 2018-02-07 DIAGNOSIS — Z1231 Encounter for screening mammogram for malignant neoplasm of breast: Secondary | ICD-10-CM | POA: Insufficient documentation

## 2018-03-29 ENCOUNTER — Other Ambulatory Visit: Payer: Self-pay | Admitting: Internal Medicine

## 2018-03-29 DIAGNOSIS — M5440 Lumbago with sciatica, unspecified side: Secondary | ICD-10-CM

## 2018-04-14 ENCOUNTER — Ambulatory Visit
Admission: RE | Admit: 2018-04-14 | Discharge: 2018-04-14 | Disposition: A | Discharge status: Home / Self Care | Payer: Medicare Other | Source: Ambulatory Visit | Attending: Internal Medicine | Admitting: Internal Medicine

## 2018-04-14 DIAGNOSIS — M47896 Other spondylosis, lumbar region: Secondary | ICD-10-CM | POA: Insufficient documentation

## 2018-04-14 DIAGNOSIS — M5440 Lumbago with sciatica, unspecified side: Secondary | ICD-10-CM | POA: Diagnosis present

## 2018-04-18 ENCOUNTER — Encounter: Payer: Self-pay | Admitting: Emergency Medicine

## 2018-04-18 ENCOUNTER — Other Ambulatory Visit: Payer: Self-pay

## 2018-04-18 ENCOUNTER — Emergency Department: Payer: Medicare Other

## 2018-04-18 ENCOUNTER — Inpatient Hospital Stay
Admission: EM | Admit: 2018-04-18 | Discharge: 2018-05-28 | DRG: 193 | Disposition: E | Payer: Medicare Other | Attending: Internal Medicine | Admitting: Internal Medicine

## 2018-04-18 DIAGNOSIS — Z882 Allergy status to sulfonamides status: Secondary | ICD-10-CM

## 2018-04-18 DIAGNOSIS — D329 Benign neoplasm of meninges, unspecified: Secondary | ICD-10-CM | POA: Diagnosis present

## 2018-04-18 DIAGNOSIS — E876 Hypokalemia: Secondary | ICD-10-CM | POA: Diagnosis not present

## 2018-04-18 DIAGNOSIS — J9690 Respiratory failure, unspecified, unspecified whether with hypoxia or hypercapnia: Secondary | ICD-10-CM

## 2018-04-18 DIAGNOSIS — Z8673 Personal history of transient ischemic attack (TIA), and cerebral infarction without residual deficits: Secondary | ICD-10-CM | POA: Diagnosis not present

## 2018-04-18 DIAGNOSIS — Z515 Encounter for palliative care: Secondary | ICD-10-CM

## 2018-04-18 DIAGNOSIS — E669 Obesity, unspecified: Secondary | ICD-10-CM | POA: Diagnosis present

## 2018-04-18 DIAGNOSIS — R0600 Dyspnea, unspecified: Secondary | ICD-10-CM

## 2018-04-18 DIAGNOSIS — E1165 Type 2 diabetes mellitus with hyperglycemia: Secondary | ICD-10-CM | POA: Diagnosis present

## 2018-04-18 DIAGNOSIS — Z79810 Long term (current) use of selective estrogen receptor modulators (SERMs): Secondary | ICD-10-CM

## 2018-04-18 DIAGNOSIS — Z66 Do not resuscitate: Secondary | ICD-10-CM | POA: Diagnosis not present

## 2018-04-18 DIAGNOSIS — I10 Essential (primary) hypertension: Secondary | ICD-10-CM | POA: Diagnosis present

## 2018-04-18 DIAGNOSIS — Z886 Allergy status to analgesic agent status: Secondary | ICD-10-CM | POA: Diagnosis not present

## 2018-04-18 DIAGNOSIS — D649 Anemia, unspecified: Secondary | ICD-10-CM | POA: Diagnosis present

## 2018-04-18 DIAGNOSIS — N179 Acute kidney failure, unspecified: Secondary | ICD-10-CM | POA: Diagnosis present

## 2018-04-18 DIAGNOSIS — J189 Pneumonia, unspecified organism: Principal | ICD-10-CM | POA: Diagnosis present

## 2018-04-18 DIAGNOSIS — R0902 Hypoxemia: Secondary | ICD-10-CM

## 2018-04-18 DIAGNOSIS — J8489 Other specified interstitial pulmonary diseases: Secondary | ICD-10-CM | POA: Diagnosis present

## 2018-04-18 DIAGNOSIS — Z87891 Personal history of nicotine dependence: Secondary | ICD-10-CM | POA: Diagnosis not present

## 2018-04-18 DIAGNOSIS — Z7982 Long term (current) use of aspirin: Secondary | ICD-10-CM | POA: Diagnosis not present

## 2018-04-18 DIAGNOSIS — E785 Hyperlipidemia, unspecified: Secondary | ICD-10-CM | POA: Diagnosis present

## 2018-04-18 DIAGNOSIS — F419 Anxiety disorder, unspecified: Secondary | ICD-10-CM | POA: Diagnosis not present

## 2018-04-18 DIAGNOSIS — J811 Chronic pulmonary edema: Secondary | ICD-10-CM | POA: Diagnosis present

## 2018-04-18 DIAGNOSIS — J9601 Acute respiratory failure with hypoxia: Secondary | ICD-10-CM | POA: Diagnosis not present

## 2018-04-18 DIAGNOSIS — R531 Weakness: Secondary | ICD-10-CM

## 2018-04-18 DIAGNOSIS — Z7189 Other specified counseling: Secondary | ICD-10-CM | POA: Diagnosis not present

## 2018-04-18 DIAGNOSIS — R0602 Shortness of breath: Secondary | ICD-10-CM | POA: Diagnosis present

## 2018-04-18 DIAGNOSIS — J9600 Acute respiratory failure, unspecified whether with hypoxia or hypercapnia: Secondary | ICD-10-CM

## 2018-04-18 DIAGNOSIS — Z791 Long term (current) use of non-steroidal anti-inflammatories (NSAID): Secondary | ICD-10-CM

## 2018-04-18 DIAGNOSIS — T380X5A Adverse effect of glucocorticoids and synthetic analogues, initial encounter: Secondary | ICD-10-CM | POA: Diagnosis present

## 2018-04-18 DIAGNOSIS — Z6829 Body mass index (BMI) 29.0-29.9, adult: Secondary | ICD-10-CM

## 2018-04-18 LAB — COMPREHENSIVE METABOLIC PANEL
ALBUMIN: 3.3 g/dL — AB (ref 3.5–5.0)
ALK PHOS: 84 U/L (ref 38–126)
ALT: 14 U/L (ref 0–44)
AST: 15 U/L (ref 15–41)
Anion gap: 8 (ref 5–15)
BILIRUBIN TOTAL: 1 mg/dL (ref 0.3–1.2)
BUN: 21 mg/dL (ref 8–23)
CO2: 25 mmol/L (ref 22–32)
CREATININE: 1.35 mg/dL — AB (ref 0.44–1.00)
Calcium: 8.7 mg/dL — ABNORMAL LOW (ref 8.9–10.3)
Chloride: 105 mmol/L (ref 98–111)
GFR calc Af Amer: 41 mL/min — ABNORMAL LOW (ref >60)
GFR calc non Af Amer: 36 mL/min — ABNORMAL LOW (ref >60)
GLUCOSE: 135 mg/dL — AB (ref 70–99)
Potassium: 3.5 mmol/L (ref 3.5–5.1)
Sodium: 138 mmol/L (ref 135–145)
TOTAL PROTEIN: 6.6 g/dL (ref 6.5–8.1)

## 2018-04-18 LAB — CBC WITH DIFFERENTIAL/PLATELET
Basophils Absolute: 0 10*3/uL (ref 0–0.1)
Basophils Relative: 0 %
EOS ABS: 0.3 10*3/uL (ref 0–0.7)
EOS PCT: 1 %
HCT: 34.9 % — ABNORMAL LOW (ref 35.0–47.0)
Hemoglobin: 11.7 g/dL — ABNORMAL LOW (ref 12.0–16.0)
LYMPHS ABS: 0.7 10*3/uL — AB (ref 1.0–3.6)
LYMPHS PCT: 3 %
MCH: 29.5 pg (ref 26.0–34.0)
MCHC: 33.6 g/dL (ref 32.0–36.0)
MCV: 88 fL (ref 80.0–100.0)
MONOS PCT: 5 %
Monocytes Absolute: 1 10*3/uL — ABNORMAL HIGH (ref 0.2–0.9)
Neutro Abs: 19.9 10*3/uL — ABNORMAL HIGH (ref 1.4–6.5)
Neutrophils Relative %: 91 %
PLATELETS: 204 10*3/uL (ref 150–440)
RBC: 3.97 MIL/uL (ref 3.80–5.20)
RDW: 15.4 % — ABNORMAL HIGH (ref 11.5–14.5)
WBC: 21.9 10*3/uL — ABNORMAL HIGH (ref 3.6–11.0)

## 2018-04-18 LAB — PROTIME-INR
INR: 1.26
PROTHROMBIN TIME: 15.7 s — AB (ref 11.4–15.2)

## 2018-04-18 LAB — BRAIN NATRIURETIC PEPTIDE: B Natriuretic Peptide: 149 pg/mL — ABNORMAL HIGH (ref 0.0–100.0)

## 2018-04-18 LAB — LACTIC ACID, PLASMA
Lactic Acid, Venous: 2.1 mmol/L (ref 0.5–1.9)
Lactic Acid, Venous: 1.1 mmol/L (ref 0.5–1.9)

## 2018-04-18 LAB — TROPONIN I: Troponin I: <0.03 ng/mL (ref <0.03)

## 2018-04-18 LAB — GLUCOSE, CAPILLARY: Glucose-Capillary: 98 mg/dL (ref 70–99)

## 2018-04-18 MED ORDER — LEVOFLOXACIN IN D5W 500 MG/100ML IV SOLN: 500.0000 mg | Freq: Once | INTRAVENOUS | Status: DC

## 2018-04-18 MED ORDER — SODIUM CHLORIDE 0.9% FLUSH: 3.0000 mL | INTRAVENOUS | Status: DC | PRN

## 2018-04-18 MED ORDER — SODIUM CHLORIDE 0.9 % IV SOLN: 500.0000 mg | INTRAVENOUS | Status: DC

## 2018-04-18 MED ORDER — INSULIN ASPART 100 UNIT/ML ~~LOC~~ SOLN
0.0000 [IU] | Freq: Three times a day (TID) | SUBCUTANEOUS | Status: DC
  Administered 2018-04-19: 2 [IU] via SUBCUTANEOUS
  Administered 2018-04-20 (×3): 3 [IU] via SUBCUTANEOUS
  Administered 2018-04-21 – 2018-04-22 (×3): 2 [IU] via SUBCUTANEOUS
  Administered 2018-04-22: 3 [IU] via SUBCUTANEOUS
  Administered 2018-04-23 (×2): 2 [IU] via SUBCUTANEOUS
  Administered 2018-04-24: 3 [IU] via SUBCUTANEOUS
  Administered 2018-04-25 – 2018-04-27 (×5): 2 [IU] via SUBCUTANEOUS
  Administered 2018-04-27: 5 [IU] via SUBCUTANEOUS
  Administered 2018-04-27: 3 [IU] via SUBCUTANEOUS
  Administered 2018-04-28 (×2): 2 [IU] via SUBCUTANEOUS
  Administered 2018-04-29 (×2): 3 [IU] via SUBCUTANEOUS
  Filled 2018-04-18 (×16): qty 1

## 2018-04-18 MED ORDER — ASPIRIN EC 81 MG PO TBEC
81.0000 mg | DELAYED_RELEASE_TABLET | Freq: Every day | ORAL | Status: DC
  Administered 2018-04-19 – 2018-04-29 (×11): 81 mg via ORAL
  Filled 2018-04-18 (×11): qty 1

## 2018-04-18 MED ORDER — SODIUM CHLORIDE 0.9 % IV SOLN
500.0000 mg | INTRAVENOUS | Status: DC
  Administered 2018-04-18: 500 mg via INTRAVENOUS
  Filled 2018-04-18 (×2): qty 500

## 2018-04-18 MED ORDER — ADULT MULTIVITAMIN W/MINERALS CH
1.0000 | ORAL_TABLET | Freq: Every day | ORAL | Status: DC
  Administered 2018-04-19 – 2018-04-29 (×10): 1 via ORAL
  Filled 2018-04-18 (×11): qty 1

## 2018-04-18 MED ORDER — GABAPENTIN 300 MG PO CAPS
300.0000 mg | ORAL_CAPSULE | Freq: Every day | ORAL | Status: DC
  Administered 2018-04-18: 23:00:00 300 mg via ORAL
  Filled 2018-04-18: qty 1

## 2018-04-18 MED ORDER — SODIUM CHLORIDE 0.9 % IV SOLN
2.0000 g | INTRAVENOUS | Status: DC
  Administered 2018-04-18: 2 g via INTRAVENOUS
  Filled 2018-04-18 (×2): qty 20

## 2018-04-18 MED ORDER — SODIUM CHLORIDE 0.9 % IV SOLN: 250.0000 mL | INTRAVENOUS | Status: DC | PRN

## 2018-04-18 MED ORDER — INSULIN ASPART 100 UNIT/ML ~~LOC~~ SOLN
0.0000 [IU] | Freq: Every day | SUBCUTANEOUS | Status: DC
  Administered 2018-04-21: 4 [IU] via SUBCUTANEOUS
  Filled 2018-04-18 (×2): qty 1

## 2018-04-18 MED ORDER — SODIUM CHLORIDE 0.9 % IV BOLUS
250.0000 mL | Freq: Once | INTRAVENOUS | Status: AC
  Administered 2018-04-18: 250 mL via INTRAVENOUS

## 2018-04-18 MED ORDER — PANTOPRAZOLE SODIUM 40 MG PO TBEC
40.0000 mg | DELAYED_RELEASE_TABLET | Freq: Two times a day (BID) | ORAL | Status: DC
  Administered 2018-04-19 – 2018-04-29 (×19): 40 mg via ORAL
  Filled 2018-04-18 (×20): qty 1

## 2018-04-18 MED ORDER — SODIUM CHLORIDE 0.9% FLUSH
3.0000 mL | Freq: Two times a day (BID) | INTRAVENOUS | Status: DC
  Administered 2018-04-18 – 2018-04-28 (×15): 3 mL via INTRAVENOUS

## 2018-04-18 MED ORDER — MELOXICAM 7.5 MG PO TABS
7.5000 mg | ORAL_TABLET | Freq: Every day | ORAL | Status: DC
  Administered 2018-04-19 – 2018-04-29 (×11): 7.5 mg via ORAL
  Filled 2018-04-18 (×14): qty 1

## 2018-04-18 MED ORDER — CEFTRIAXONE SODIUM 1 G IJ SOLR
1.0000 g | INTRAMUSCULAR | Status: DC
  Filled 2018-04-18: qty 10

## 2018-04-18 MED ORDER — RALOXIFENE HCL 60 MG PO TABS
60.0000 mg | ORAL_TABLET | Freq: Every day | ORAL | Status: DC
  Administered 2018-04-19 – 2018-04-29 (×11): 60 mg via ORAL
  Filled 2018-04-18 (×13): qty 1

## 2018-04-18 MED ORDER — DILTIAZEM HCL ER COATED BEADS 300 MG PO CP24
300.0000 mg | ORAL_CAPSULE | Freq: Every day | ORAL | Status: DC
  Administered 2018-04-19 – 2018-04-28 (×10): 300 mg via ORAL
  Filled 2018-04-18 (×12): qty 1

## 2018-04-18 MED ORDER — GABAPENTIN 300 MG PO CAPS
600.0000 mg | ORAL_CAPSULE | Freq: Every day | ORAL | Status: DC
  Administered 2018-04-19 – 2018-04-27 (×9): 600 mg via ORAL
  Filled 2018-04-18 (×9): qty 2

## 2018-04-18 MED ORDER — ACETAMINOPHEN 325 MG PO TABS
650.0000 mg | ORAL_TABLET | Freq: Four times a day (QID) | ORAL | Status: DC | PRN
  Administered 2018-04-19 – 2018-04-21 (×2): 650 mg via ORAL
  Filled 2018-04-18 (×2): qty 2

## 2018-04-18 MED ORDER — ENOXAPARIN SODIUM 40 MG/0.4ML ~~LOC~~ SOLN
40.0000 mg | SUBCUTANEOUS | Status: DC
  Administered 2018-04-18 – 2018-04-28 (×11): 40 mg via SUBCUTANEOUS
  Filled 2018-04-18 (×11): qty 0.4

## 2018-04-18 NOTE — ED Notes (Signed)
Date and time results received: 03/31/2018 6:49 PM (use smartphrase ".now" to insert current time)  Test: Lactic acid Critical Value: 2.1  Name of Provider Notified: Dr. Quentin Cornwall  Orders Received? Or Actions Taken?: no new orders at this time

## 2018-04-18 NOTE — ED Triage Notes (Addendum)
Pt to ED via POV with c/o SOB x 3days, with chest tightness. Pt states fevers at home, last use of tylenol was 1200. 93% on RA. A&PO1

## 2018-04-18 NOTE — Progress Notes (Signed)
Advanced care plan.  Purpose of the Encounter: CODE STATUS  Parties in Attendance: Patient  Patient's Decision Capacity: Good   Subjective/Patient's story: Presented to the emergency room for shortness of breath and cough   Objective/Medical story Has pneumonia needs IV antibiotics    Goals of care determination:  Advance care directives and goals of care discussed Patient does not want any cardiac resuscitation, intubation or ventilator if the need arises  CODE STATUS: DNR   Time spent discussing advanced care planning: 16 minutes

## 2018-04-18 NOTE — H&P (Signed)
[facility] NAME: Natasha Bentley    MR#:  [id number]  DATE OF BIRTH:  06/06/1935  DATE OF ADMISSION:  04/25/2018  PRIMARY CARE PHYSICIAN: [name], MD   REQUESTING/REFERRING PHYSICIAN:   CHIEF COMPLAINT:   Chief Complaint  Patient presents with  . Shortness of Breath    HISTORY OF PRESENT ILLNESS: Natasha Bentley  is a 83 y.o. female with a known history of diabetes mellitus type 2, hyperlipidemia, hypertension presented to the emergency room for cough and shortness of breath and fever.  This is been going on for the last few days.  Patient was worked up in the emergency room WBC count was elevated chest x-ray showed pneumonia. No history of recent travel, sick contacts at home.. Cough is productive of yellowish phlegm.  Hospitalist service was consulted after IV antibiotics were given in the emergency room.  PAST MEDICAL HISTORY:   Past Medical History:  Diagnosis Date  . Anemia   . CVA (cerebral infarction)   . Diabetes mellitus without complication ([place])   . Hyperlipidemia   . Hypertension   . Meningioma ([place])     PAST SURGICAL HISTORY:  Past Surgical History:  Procedure Laterality Date  . ABDOMINAL HYSTERECTOMY    . BREAST EXCISIONAL BIOPSY Left 1960's   benign  . CHOLECYSTECTOMY    . COLONOSCOPY    . COLONOSCOPY WITH PROPOFOL N/A 12/25/2015   Procedure: COLONOSCOPY WITH PROPOFOL;  Surgeon: Manya Silvas, MD;  [facility] ENDOSCOPY;  Service: Endoscopy;  Laterality: N/A;    SOCIAL HISTORY:  Social History   Tobacco Use  . Smoking status: Former Research scientist (life sciences)  . Smokeless tobacco: Never Used  Substance Use Topics  . Alcohol use: No    FAMILY HISTORY:  Family History  Problem Relation Age of Onset  . Breast cancer Mother 73  . Breast cancer Maternal Grandmother 60    DRUG ALLERGIES:  Allergies  Allergen Reactions  . Sulfa Antibiotics     REVIEW OF SYSTEMS:   CONSTITUTIONAL: No fever,  fatigue or weakness.  EYES: No blurred or double vision.  EARS, NOSE, AND THROAT: No tinnitus or ear pain.  RESPIRATORY: Has cough, shortness of breath, no wheezing or hemoptysis.  CARDIOVASCULAR: No chest pain, orthopnea, edema.  GASTROINTESTINAL: No nausea, vomiting, diarrhea or abdominal pain.  GENITOURINARY: No dysuria, hematuria.  ENDOCRINE: No polyuria, nocturia,  HEMATOLOGY: No anemia, easy bruising or bleeding SKIN: No rash or lesion. MUSCULOSKELETAL: No joint pain or arthritis.   NEUROLOGIC: No tingling, numbness, weakness.  PSYCHIATRY: No anxiety or depression.   MEDICATIONS AT HOME:  Prior to Admission medications   Medication Sig Start Date End Date Taking? Authorizing Provider  aspirin 81 MG tablet Take 81 mg by mouth daily.    [provider]  diltiazem (TIAZAC) 300 MG 24 hr capsule Take 300 mg by mouth daily.    [provider]  gabapentin (NEURONTIN) 300 MG capsule Take 300 mg by mouth at bedtime.    [provider]  hydrochlorothiazide (HYDRODIURIL) 12.5 MG tablet Take 12.5 mg by mouth daily.    [provider]  magnesium 30 MG tablet Take 250 mg by mouth 2 (two) times daily.    [provider]  meloxicam (MOBIC) 7.5 MG tablet Take 7.5 mg by mouth daily.    [provider]  Multiple Vitamin (MULTIVITAMIN) tablet Take 1 tablet by mouth daily.    [provider]  pantoprazole (PROTONIX) 40  MG tablet Take 40 mg by mouth 2 (two) times daily.    [provider]  raloxifene (EVISTA) 60 MG tablet Take 60 mg by mouth daily.    [provider]      PHYSICAL EXAMINATION:   VITAL SIGNS: Blood pressure 117/61, pulse 93, temperature 98.8 F (37.1 C), temperature source Oral, resp. rate (!) 23, height 5\' 10"  (1.778 m), weight 95.3 kg (210 lb), SpO2 92 %.  GENERAL:  83 y.o.-year-old patient lying in the bed with no acute distress.  EYES: Pupils equal, round, reactive to light and accommodation. No  scleral icterus. Extraocular muscles intact.  HEENT: Head atraumatic, normocephalic. Oropharynx and nasopharynx clear.  NECK:  Supple, no jugular venous distention. No thyroid enlargement, no tenderness.  LUNGS: Decreased breath sounds bilaterally, bilateral rales heard. No use of accessory muscles of respiration.  CARDIOVASCULAR: S1, S2 normal. No murmurs, rubs, or gallops.  ABDOMEN: Soft, nontender, nondistended. Bowel sounds present. No organomegaly or mass.  EXTREMITIES: No pedal edema, cyanosis, or clubbing.  NEUROLOGIC: Cranial nerves II through XII are intact. Muscle strength 5/5 in all extremities. Sensation intact. Gait not checked.  PSYCHIATRIC: The patient is alert and oriented x 3.  SKIN: No obvious rash, lesion, or ulcer.   LABORATORY PANEL:   CBC Recent Labs  Lab 04/22/2018 1740  WBC 21.9*  HGB 11.7*  HCT 34.9*  PLT 204  MCV 88.0  MCH 29.5  MCHC 33.6  RDW 15.4*  LYMPHSABS 0.7*  MONOABS 1.0*  EOSABS 0.3  BASOSABS 0.0   ------------------------------------------------------------------------------------------------------------------  Chemistries  Recent Labs  Lab 04/12/2018 1740  NA 138  K 3.5  CL 105  CO2 25  GLUCOSE 135*  BUN 21  CREATININE 1.35*  CALCIUM 8.7*  AST 15  ALT 14  ALKPHOS 84  BILITOT 1.0   ------------------------------------------------------------------------------------------------------------------ estimated creatinine clearance is 40.9 mL/min (A) (by C-G formula based on SCr of 1.35 mg/dL (H)). ------------------------------------------------------------------------------------------------------------------ No results for input(s): TSH, T4TOTAL, T3FREE, THYROIDAB in the last 72 hours.  Invalid input(s): FREET3   Coagulation profile Recent Labs  Lab 04/22/2018 1740  INR 1.26   ------------------------------------------------------------------------------------------------------------------- No results for input(s): DDIMER in the  last 72 hours. -------------------------------------------------------------------------------------------------------------------  Cardiac Enzymes Recent Labs  Lab 04/06/2018 1742  TROPONINI <0.03   ------------------------------------------------------------------------------------------------------------------ Invalid input(s): POCBNP  ---------------------------------------------------------------------------------------------------------------  Urinalysis    Component Value Date/Time   COLORURINE Yellow 01/24/2013 2214   APPEARANCEUR Clear 01/24/2013 2214   LABSPEC 1.020 01/24/2013 2214   PHURINE 5.0 01/24/2013 2214   GLUCOSEU Negative 01/24/2013 2214   HGBUR Negative 01/24/2013 2214   BILIRUBINUR Negative 01/24/2013 2214   KETONESUR Negative 01/24/2013 2214   PROTEINUR Negative 01/24/2013 2214   NITRITE Negative 01/24/2013 2214   LEUKOCYTESUR Trace 01/24/2013 2214     RADIOLOGY: Dg Chest 2 View  Result Date: 03/30/2018 CLINICAL DATA:  Chest pain and short of breath EXAM: CHEST - 2 VIEW COMPARISON:  01/24/2013 FINDINGS: Hyperinflation. There are small pleural effusions. Diffuse bilateral coarse interstitial opacity with patchy peripheral ground-glass opacity in the bilateral right greater than left lung bases. Normal heart size. No pneumothorax. IMPRESSION: 1. Small pleural effusions 2. Diffuse increased interstitial opacity with mild peripheral ground-glass opacity in the right greater than left lung bases. Suspect that there may be some component of underlying interstitial lung disease. Findings suspected to represent acute inflammatory process or edema on underlying chronic change. Electronically Signed   By: Donavan Foil M.D.   On: 03/31/2018 18:32    EKG: Orders placed or  performed during the hospital encounter of 04/10/2018  . EKG 12-Lead  . EKG 12-Lead    IMPRESSION AND PLAN: 83 year old female patient with history of diabetes mellitus, hypertension,  hyperlipidemia presented to the emergency room for cough, fever and shortness of breath  -Community-acquired pneumonia Start patient on IV Rocephin and Zithromax antibiotics Follow-up cultures  -Type 2 diabetes mellitus Diabetic diet with sliding scale coverage with insulin  -DVT prophylaxis subcu Lovenox daily  -Small pleural effusions could be secondary to pneumonia Check echocardiogram to assess LV function  All the records are reviewed and case discussed with ED provider. Management plans discussed with the patient, family and they are in agreement.  CODE STATUS: DNR    Code Status Orders  (From admission, onward)        Start     Ordered   04/06/2018 2015  Do not attempt resuscitation (DNR)  Continuous    Question Answer Comment  In the event of cardiac or respiratory ARREST Do not call a "code blue"   In the event of cardiac or respiratory ARREST Do not perform Intubation, CPR, defibrillation or ACLS   In the event of cardiac or respiratory ARREST Use medication by any route, position, wound care, and other measures to relive pain and suffering. May use oxygen, suction and manual treatment of airway obstruction as needed for comfort.      04/10/2018 2014    Code Status History    This patient has a current code status but no historical code status.       TOTAL TIME TAKING CARE OF THIS PATIENT: 53 minutes.    Saundra Shelling M.D on 04/23/2018 at 8:14 PM  Between 7am to 6pm - Pager - [phone]  After 6pm go to [email or web address] - password [email or web address] [facility]  Office  [phone]  CC: Primary care physician; [name], MD

## 2018-04-18 NOTE — ED Notes (Addendum)
Medication not in main ED.  Pharmacy notified to send patient medication.

## 2018-04-18 NOTE — ED Provider Notes (Signed)
[facility] Emergency Department Provider Note    First MD Initiated Contact with Patient 04/22/2018 1759     (approximate)  I have reviewed the triage vital signs and the nursing notes.   HISTORY  Chief Complaint Shortness of Breath    HPI Natasha Bentley is a 83 y.o. female no recent hospitalizations in the below listed past medical history presents the ER with 3 days of generalized chest tightness generalized weakness and nausea with fevers to 101 at home.  States that she has had a nonproductive cough.  Starting last week she was put on Z-Pak as well as steroids.  States that she was feeling better Friday evening and then woke up feeling very poor with generalized weakness.  Symptoms have not improved.  Denies any abdominal pain.    Past Medical History:  Diagnosis Date  . Anemia   . CVA (cerebral infarction)   . Diabetes mellitus without complication ([place])   . Hyperlipidemia   . Hypertension   . Meningioma ([place])    Family History  Problem Relation Age of Onset  . Breast cancer Mother 67  . Breast cancer Maternal Grandmother 43   Past Surgical History:  Procedure Laterality Date  . ABDOMINAL HYSTERECTOMY    . BREAST EXCISIONAL BIOPSY Left 1960's   benign  . CHOLECYSTECTOMY    . COLONOSCOPY    . COLONOSCOPY WITH PROPOFOL N/A 12/25/2015   Procedure: COLONOSCOPY WITH PROPOFOL;  Surgeon: Manya Silvas, MD;  Location: [facility] ENDOSCOPY;  Service: Endoscopy;  Laterality: N/A;   There are no active problems to display for this patient.     Prior to Admission medications   Medication Sig Start Date End Date Taking? Authorizing Provider  aspirin 81 MG tablet Take 81 mg by mouth daily.    [provider]  diltiazem (TIAZAC) 300 MG 24 hr capsule Take 300 mg by mouth daily.    [provider]  gabapentin (NEURONTIN) 300 MG capsule Take 300 mg by mouth at bedtime.    [provider]  hydrochlorothiazide (HYDRODIURIL)  12.5 MG tablet Take 12.5 mg by mouth daily.    [provider]  magnesium 30 MG tablet Take 250 mg by mouth 2 (two) times daily.    [provider]  meloxicam (MOBIC) 7.5 MG tablet Take 7.5 mg by mouth daily.    [provider]  Multiple Vitamin (MULTIVITAMIN) tablet Take 1 tablet by mouth daily.    [provider]  pantoprazole (PROTONIX) 40 MG tablet Take 40 mg by mouth 2 (two) times daily.    [provider]  raloxifene (EVISTA) 60 MG tablet Take 60 mg by mouth daily.    [provider]    Allergies Sulfa antibiotics    Social History Social History   Tobacco Use  . Smoking status: Former Research scientist (life sciences)  . Smokeless tobacco: Never Used  Substance Use Topics  . Alcohol use: No  . Drug use: No    Review of Systems Patient denies headaches, rhinorrhea, blurry vision, numbness, shortness of breath, chest pain, edema, cough, abdominal pain, nausea, vomiting, diarrhea, dysuria, fevers, rashes or hallucinations unless otherwise stated above in HPI. ____________________________________________   PHYSICAL EXAM:  VITAL SIGNS: Vitals:   04/11/2018 1738 03/29/2018 1800  BP: 96/78 105/86  Pulse: 99 90  Resp: 16 (!) 21  Temp: 98.8 F (37.1 C)   SpO2: 92% 93%    Constitutional: Alert and oriented frail and fatigued appearing Eyes: Conjunctivae are normal.  Head: Atraumatic. Nose: No congestion/rhinnorhea. Mouth/Throat: Mucous membranes are moist.   Neck: No stridor. Painless ROM.  Cardiovascular: Normal rate, regular rhythm. Grossly normal heart sounds.  Good peripheral circulation. Respiratory: Normal respiratory effort.  No retractions. Lungs with bibasilar crackles Gastrointestinal: Soft and nontender. No distention. No abdominal bruits. No CVA tenderness. Genitourinary: deferred Musculoskeletal: No lower extremity tenderness.  BLE 1+ edema.  No joint effusions. Neurologic:  Normal speech and language. No gross focal neurologic  deficits are appreciated. No facial droop Skin:  Skin is warm, dry and intact. No rash noted. Psychiatric: Mood and affect are normal. Speech and behavior are normal.  ____________________________________________   LABS (all labs ordered are listed, but only abnormal results are displayed)  Results for orders placed or performed during the hospital encounter of 04/15/2018 (from the past 24 hour(s))  Comprehensive metabolic panel     Status: Abnormal   Collection Time: 04/25/2018  5:40 PM  Result Value Ref Range   Sodium 138 135 - 145 mmol/L   Potassium 3.5 3.5 - 5.1 mmol/L   Chloride 105 98 - 111 mmol/L   CO2 25 22 - 32 mmol/L   Glucose, Bld 135 (H) 70 - 99 mg/dL   BUN 21 8 - 23 mg/dL   Creatinine, Ser 1.35 (H) 0.44 - 1.00 mg/dL   Calcium 8.7 (L) 8.9 - 10.3 mg/dL   Total Protein 6.6 6.5 - 8.1 g/dL   Albumin 3.3 (L) 3.5 - 5.0 g/dL   AST 15 15 - 41 U/L   ALT 14 0 - 44 U/L   Alkaline Phosphatase 84 38 - 126 U/L   Total Bilirubin 1.0 0.3 - 1.2 mg/dL   GFR calc non Af Amer 36 (L) >60 mL/min   GFR calc Af Amer 41 (L) >60 mL/min   Anion gap 8 5 - 15  CBC with Differential     Status: Abnormal   Collection Time: 04/11/2018  5:40 PM  Result Value Ref Range   WBC 21.9 (H) 3.6 - 11.0 K/uL   RBC 3.97 3.80 - 5.20 MIL/uL   Hemoglobin 11.7 (L) 12.0 - 16.0 g/dL   HCT 34.9 (L) 35.0 - 47.0 %   MCV 88.0 80.0 - 100.0 fL   MCH 29.5 26.0 - 34.0 pg   MCHC 33.6 32.0 - 36.0 g/dL   RDW 15.4 (H) 11.5 - 14.5 %   Platelets 204 150 - 440 K/uL   Neutrophils Relative % 91 %   Neutro Abs 19.9 (H) 1.4 - 6.5 K/uL   Lymphocytes Relative 3 %   Lymphs Abs 0.7 (L) 1.0 - 3.6 K/uL   Monocytes Relative 5 %   Monocytes Absolute 1.0 (H) 0.2 - 0.9 K/uL   Eosinophils Relative 1 %   Eosinophils Absolute 0.3 0 - 0.7 K/uL   Basophils Relative 0 %   Basophils Absolute 0.0 0 - 0.1 K/uL  Protime-INR     Status: Abnormal   Collection Time: 04/17/2018  5:40 PM  Result Value Ref Range   Prothrombin Time 15.7 (H) 11.4 - 15.2  seconds   INR 1.26    ____________________________________________  EKG My review and personal interpretation at Time: 17:41   Indication: shortness  Rate: 95  Rhythm: sinus Axis: normal Other: normal intervals, nonspecific st abn ____________________________________________  RADIOLOGY  I personally reviewed all radiographic images ordered to evaluate for the above acute complaints and reviewed radiology reports and findings.  These findings were personally discussed with the patient.  Please see medical record for  radiology report.  ____________________________________________   PROCEDURES  Procedure(s) performed:  .Critical Care Performed by: Merlyn Lot, MD Authorized by: Merlyn Lot, MD   Critical care provider statement:    Critical care time (minutes):  30   Critical care time was exclusive of:  Separately billable procedures and treating other patients   Critical care was necessary to treat or prevent imminent or life-threatening deterioration of the following conditions:  Respiratory failure and sepsis   Critical care was time spent personally by me on the following activities:  Development of treatment plan with patient or surrogate, discussions with consultants, evaluation of patient's response to treatment, examination of patient, obtaining history from patient or surrogate, ordering and performing treatments and interventions, ordering and review of laboratory studies, ordering and review of radiographic studies, pulse oximetry, re-evaluation of patient's condition and review of old charts      Critical Care performed: yes  ____________________________________________   INITIAL IMPRESSION / [place] / ED COURSE  Pertinent labs & imaging results that were available during my care of the patient were reviewed by me and considered in my medical decision making (see chart for details).   DDX: pna, anemia, uti, dehydration, metabolic abn, chf,  ptx, copd  KIANNI LHEUREUX is a 83 y.o. who presents to the ED with concerns as described above.  Patient with mild respiratory distress requiring supplemental oxygen.  Blood will be sent for the above differential.  Currently afebrile but reporting fevers at home.  Initial blood work does show evidence of significant leukocytosis partially secondary to recent steroid use but does have left shift.  Will evaluate for sepsis as well as COPD congestive heart failure in the above differential.  Clinical Course as of Apr 18 2045  Tue Apr 18, 2018  1948 Discussed results with patient and family at bedside.  Based on her chest x-ray and mildly elevated BNP I am concern for some component of underlying congestive heart failure.  Given her recent fevers leukocytosis and productive cough I do suspect that today's presentation is secondary to community-acquired pneumonia but would likely benefit from echocardiogram and further evaluation of congestive heart failure.  Patient will be given IV antibiotics as well as gentle IV fluid hydration for her mildly elevated lactate.  She is hemodynamically stable and lactate is only borderline elevated will not call code sepsis.  Patient clinically stable at this time for admission to the hospital.  Have discussed with the patient and available family all diagnostics and treatments performed thus far and all questions were answered to the best of my ability. The patient demonstrates understanding and agreement with plan.    [PR]    Clinical Course User Index [PR] Merlyn Lot, MD     As part of my medical decision making, I reviewed the following data within the [place] notes reviewed and incorporated, Labs reviewed, notes from prior ED visits and [place] Controlled Substance Database   ____________________________________________   FINAL CLINICAL IMPRESSION(S) / ED DIAGNOSES  Final diagnoses:  Shortness of breath  Weakness    Community acquired pneumonia, unspecified laterality      NEW MEDICATIONS STARTED DURING THIS VISIT:  New Prescriptions   No medications on file     Note:  This document was prepared using Dragon voice recognition software and may include unintentional dictation errors.    Merlyn Lot, MD 03/31/2018 2047

## 2018-04-19 ENCOUNTER — Inpatient Hospital Stay
Admit: 2018-04-19 | Discharge: 2018-04-19 | Disposition: A | Discharge status: Home / Self Care | Payer: Medicare Other | Attending: Internal Medicine | Admitting: Internal Medicine

## 2018-04-19 ENCOUNTER — Inpatient Hospital Stay: Payer: Medicare Other

## 2018-04-19 LAB — GLUCOSE, CAPILLARY
GLUCOSE-CAPILLARY: 136 mg/dL — AB (ref 70–99)
GLUCOSE-CAPILLARY: 113 mg/dL — AB (ref 70–99)
GLUCOSE-CAPILLARY: 126 mg/dL — AB (ref 70–99)
Glucose-Capillary: 112 mg/dL — ABNORMAL HIGH (ref 70–99)

## 2018-04-19 LAB — BASIC METABOLIC PANEL
Anion gap: 6 (ref 5–15)
BUN: 17 mg/dL (ref 8–23)
CALCIUM: 8.4 mg/dL — AB (ref 8.9–10.3)
CO2: 25 mmol/L (ref 22–32)
CREATININE: 1.08 mg/dL — AB (ref 0.44–1.00)
Chloride: 111 mmol/L (ref 98–111)
GFR calc non Af Amer: 47 mL/min — ABNORMAL LOW (ref >60)
GFR, EST AFRICAN AMERICAN: 54 mL/min — AB (ref >60)
GLUCOSE: 107 mg/dL — AB (ref 70–99)
Potassium: 3.4 mmol/L — ABNORMAL LOW (ref 3.5–5.1)
Sodium: 142 mmol/L (ref 135–145)

## 2018-04-19 LAB — URINALYSIS, COMPLETE (UACMP) WITH MICROSCOPIC
BILIRUBIN URINE: NEGATIVE
Bacteria, UA: NONE SEEN
Glucose, UA: NEGATIVE mg/dL
Hgb urine dipstick: NEGATIVE
KETONES UR: NEGATIVE mg/dL
LEUKOCYTES UA: NEGATIVE
Nitrite: NEGATIVE
PROTEIN: 30 mg/dL — AB
Specific Gravity, Urine: 1.015 (ref 1.005–1.030)
pH: 6 (ref 5.0–8.0)

## 2018-04-19 LAB — BLOOD GAS, ARTERIAL
Acid-base deficit: 2.3 mmol/L — ABNORMAL HIGH (ref 0.0–2.0)
BICARBONATE: 21.1 mmol/L (ref 20.0–28.0)
FIO2: 1
O2 SAT: 90.8 %
PCO2 ART: 31 mmHg — AB (ref 32.0–48.0)
PH ART: 7.44 (ref 7.350–7.450)
Patient temperature: 37
pO2, Arterial: 58 mmHg — ABNORMAL LOW (ref 83.0–108.0)

## 2018-04-19 LAB — CBC
HCT: 32.1 % — ABNORMAL LOW (ref 35.0–47.0)
Hemoglobin: 10.8 g/dL — ABNORMAL LOW (ref 12.0–16.0)
MCH: 29.4 pg (ref 26.0–34.0)
MCHC: 33.5 g/dL (ref 32.0–36.0)
MCV: 87.9 fL (ref 80.0–100.0)
PLATELETS: 170 10*3/uL (ref 150–440)
RBC: 3.65 MIL/uL — ABNORMAL LOW (ref 3.80–5.20)
RDW: 15.3 % — ABNORMAL HIGH (ref 11.5–14.5)
WBC: 20.5 10*3/uL — ABNORMAL HIGH (ref 3.6–11.0)

## 2018-04-19 LAB — MRSA PCR SCREENING: MRSA BY PCR: NEGATIVE

## 2018-04-19 MED ORDER — CEPASTAT 14.5 MG MT LOZG
1.0000 | LOZENGE | OROMUCOSAL | Status: DC | PRN
  Filled 2018-04-19: qty 9

## 2018-04-19 MED ORDER — IPRATROPIUM-ALBUTEROL 0.5-2.5 (3) MG/3ML IN SOLN
3.0000 mL | RESPIRATORY_TRACT | Status: DC
  Administered 2018-04-19 – 2018-04-20 (×5): 3 mL via RESPIRATORY_TRACT
  Filled 2018-04-19 (×5): qty 3

## 2018-04-19 MED ORDER — ORAL CARE MOUTH RINSE
15.0000 mL | Freq: Two times a day (BID) | OROMUCOSAL | Status: DC
  Administered 2018-04-20 – 2018-04-28 (×6): 15 mL via OROMUCOSAL

## 2018-04-19 MED ORDER — IOPAMIDOL (ISOVUE-370) INJECTION 76%
75.0000 mL | Freq: Once | INTRAVENOUS | Status: AC | PRN
Start: 2018-04-19 — End: 2018-04-19
  Administered 2018-04-19: 16:00:00 75 mL via INTRAVENOUS

## 2018-04-19 MED ORDER — IBUPROFEN 400 MG PO TABS
400.0000 mg | ORAL_TABLET | Freq: Four times a day (QID) | ORAL | Status: DC | PRN
  Administered 2018-04-19: 18:00:00 400 mg via ORAL
  Filled 2018-04-19: qty 1

## 2018-04-19 MED ORDER — IRBESARTAN 150 MG PO TABS
150.0000 mg | ORAL_TABLET | Freq: Every day | ORAL | Status: DC
  Administered 2018-04-19: 150 mg via ORAL
  Filled 2018-04-19: qty 1

## 2018-04-19 MED ORDER — HYDROCHLOROTHIAZIDE 25 MG PO TABS
25.0000 mg | ORAL_TABLET | Freq: Every day | ORAL | Status: DC
  Administered 2018-04-19 – 2018-04-28 (×10): 25 mg via ORAL
  Filled 2018-04-19 (×10): qty 1

## 2018-04-19 MED ORDER — POTASSIUM CHLORIDE CRYS ER 20 MEQ PO TBCR
20.0000 meq | EXTENDED_RELEASE_TABLET | Freq: Once | ORAL | Status: AC
  Administered 2018-04-19: 20 meq via ORAL
  Filled 2018-04-19: qty 1

## 2018-04-19 MED ORDER — VANCOMYCIN HCL 10 G IV SOLR
1250.0000 mg | INTRAVENOUS | Status: DC
  Administered 2018-04-20: 1250 mg via INTRAVENOUS
  Filled 2018-04-19 (×2): qty 1250

## 2018-04-19 MED ORDER — FUROSEMIDE 10 MG/ML IJ SOLN
80.0000 mg | Freq: Once | INTRAMUSCULAR | Status: AC
  Administered 2018-04-19: 20:00:00 80 mg via INTRAVENOUS
  Filled 2018-04-19: qty 8

## 2018-04-19 MED ORDER — VANCOMYCIN HCL 10 G IV SOLR
1250.0000 mg | Freq: Once | INTRAVENOUS | Status: AC
  Administered 2018-04-19: 19:00:00 1250 mg via INTRAVENOUS
  Filled 2018-04-19: qty 1250

## 2018-04-19 MED ORDER — SODIUM CHLORIDE 0.9 % IV SOLN
2.0000 g | Freq: Two times a day (BID) | INTRAVENOUS | Status: DC
  Administered 2018-04-19 – 2018-04-23 (×8): 2 g via INTRAVENOUS
  Filled 2018-04-19 (×9): qty 2

## 2018-04-19 MED ORDER — MENTHOL 3 MG MT LOZG
1.0000 | LOZENGE | OROMUCOSAL | Status: DC | PRN
  Filled 2018-04-19: qty 9

## 2018-04-19 NOTE — Progress Notes (Signed)
Upon awakening from nap, patient appears short of breath. Patient sat 86% on 5L. O2 increased to 6L. Patient wheezing. MD called and made aware of above. See new orders.   RT called to administer breathing treatment. Madlyn Frankel, RN 3:21 PM

## 2018-04-19 NOTE — Progress Notes (Signed)
Patient is here with pneumonia, has had progressively increasing oxygen requirement.  On non-rebreather tonight, with O2 sats still dropping into the 80s.  ABG shows hypoxia, will transfer to stepdown for BiPAP.  Jacqulyn Bath [facility] 04/19/2018, 11:25 PM

## 2018-04-19 NOTE — Consult Note (Signed)
Name: Natasha Bentley MRN: [id number] DOB: March 30, 1935    ADMISSION DATE:  04/01/2018 CONSULTATION DATE: 04/19/2018  REFERRING MD : Dr. Jannifer Franklin  CHIEF COMPLAINT: Shortness of Breath   BRIEF PATIENT DESCRIPTION:  83 yo female admitted to the medsurg unit 07/23 with acute hypoxic respiratory failure secondary to pneumonia vs interstitial pneumonitis requiring transfer to the stepdown unit 07/24 with worsening hypoxia requiring Bipap   SIGNIFICANT EVENTS/STUDIES:  07/23 Pt admitted to medsurg unit  07/24 CTA Chest revealed Multiple rounded and oval confluent opacities in the right middle lobe, suspicious for multifocal pneumonia. A follow-up chest CT is recommended in 3 months to exclude an underlying mass. Patchy interstitial prominence in both lungs. This is compatible with interstitial pneumonitis with a possible element of interstitial pulmonary edema. Small bilateral pleural effusions with mild bilateral dependent atelectasis. Mild mediastinal and bilateral hilar adenopathy. This is most likely reactive. Metastatic adenopathy is less likely. No pulmonary emboli seen. Small hiatal hernia. 9 mm left lobe thyroid nodule, too small to characterize, but most likely benign in the absence of known clinical risk factors for thyroid carcinoma. Calcific coronary artery and aortic atherosclerosis. Aortic Atherosclerosis (ICD10-I70.0). 07/24 Pt transferred to stepdown unit requiring Bipap   HISTORY OF PRESENT ILLNESS:   This is an 83 yo female with a PMH of Meningioma, HTN, Hyperlipidemia, Diabetes Mellitus, CVA, and Anemia.  She presented to [facility] ER on 07/23 with c/o shortness of breath, generalized weakness, nonproductive cough, and fever temp of 101 F onset of symptoms 3 days prior presentation.  Per ER notes she was prescribed a z-pak and steroids 1 week prior to presentation with slight improvement in symptoms initially.  However, she began to develop generalized weakness prompting ER visit.  CXR  revealed pulmonary edema and BNP 149, pt febrile concerning for possible pneumonia.  She received iv abx and gentle iv fluid hydration.  She was subsequently admitted to the [facility] unit for further workup and treatment.  On 07/24 she developed worsening hypoxia CTA Chest revealed possible multifocal pneumonia vs. interstitial pneumonitis with possible interstitial pulmonary edema.  She received 80 mg iv lasix x1 dose.  She subsequently required transfer to the stepdown unit for Bipap.    PAST MEDICAL HISTORY :   has a past medical history of Anemia, CVA (cerebral infarction), Diabetes mellitus without complication ([place]), Hyperlipidemia, Hypertension, and Meningioma ([place]).  has a past surgical history that includes Cholecystectomy; Abdominal hysterectomy; Colonoscopy; Colonoscopy with propofol (N/A, 12/25/2015); and Breast excisional biopsy (Left, 1960's). Prior to Admission medications   Medication Sig Start Date End Date Taking? Authorizing Provider  acidophilus (RISAQUAD) CAPS capsule Take 1 capsule by mouth 3 (three) times a week.   Yes [provider]  ALPHA LIPOIC ACID PO Take 1 capsule by mouth daily.   Yes [provider]  aspirin 81 MG tablet Take 81 mg by mouth daily.   Yes [provider]  calcium-vitamin D (CALCIUM 500/D) 500-200 MG-UNIT tablet Take 1 tablet by mouth 2 (two) times daily.   Yes [provider]  diltiazem (TIAZAC) 300 MG 24 hr capsule Take 300 mg by mouth daily.   Yes [provider]  gabapentin (NEURONTIN) 300 MG capsule Take 600 mg by mouth at bedtime.    Yes [provider]  hydrochlorothiazide (HYDRODIURIL) 12.5 MG tablet Take 12.5 mg by mouth daily.   Yes [provider]  magnesium oxide (MAG-OX) 400 MG tablet Take 400 mg by mouth daily.   Yes [provider]  nabumetone (RELAFEN) 500 MG tablet Take 500 mg by mouth 2 (two) times daily as needed (back pain).   Yes [provider]    pantoprazole (PROTONIX) 40 MG tablet Take 40 mg by mouth daily.    Yes [provider]  raloxifene (EVISTA) 60 MG tablet Take 60 mg by mouth daily.   Yes [provider]  telmisartan (MICARDIS) 40 MG tablet Take 40 mg by mouth daily.   Yes [provider]   Allergies  Allergen Reactions  . Etodolac Diarrhea  . Sulfa Antibiotics     FAMILY HISTORY:  family history includes Breast cancer (age of onset: 27) in her maternal grandmother; Breast cancer (age of onset: 67) in her mother. SOCIAL HISTORY:  reports that she has quit smoking. She has never used smokeless tobacco. She reports that she does not drink alcohol or use drugs.  REVIEW OF SYSTEMS: Positives in BOLD  Constitutional: fever, chills, weight loss, malaise/fatigue and diaphoresis.  HENT: Negative for hearing loss, ear pain, nosebleeds, congestion, sore throat, neck pain, tinnitus and ear discharge.   Eyes: Negative for blurred vision, double vision, photophobia, pain, discharge and redness.  Respiratory: cough, hemoptysis, sputum production, shortness of breath, wheezing and stridor.   Cardiovascular: pleuritic chest pain, palpitations, orthopnea, claudication, leg swelling and PND.  Gastrointestinal: Negative for heartburn, nausea, vomiting, abdominal pain, diarrhea, constipation, blood in stool and melena.  Genitourinary: Negative for dysuria, urgency, frequency, hematuria and flank pain.  Musculoskeletal: Negative for myalgias, back pain, joint pain and falls.  Skin: Negative for itching and rash.  Neurological: Negative for dizziness, tingling, tremors, sensory change, speech change, focal weakness, seizures, loss of consciousness, weakness and headaches.  Endo/Heme/Allergies: Negative for environmental allergies and polydipsia. Does not bruise/bleed easily.  SUBJECTIVE:  Pt c/o chest pain with coughing   VITAL SIGNS: Temp:  [97.5 F (36.4 C)-99.1 F (37.3 C)] 97.5 F (36.4 C) (07/24  2002) Pulse Rate:  [79-102] 94 (07/24 2212) Resp:  [16-24] 24 (07/24 2002) BP: (100-137)/(50-67) 129/57 (07/24 2212) SpO2:  [81 %-93 %] 91 % (07/24 2212)  PHYSICAL EXAMINATION: General: acutely ill appearing female, NAD on NRB Neuro: alert and oriented, follows commands  HEENT: supple, no JVD Cardiovascular: nsr, rrr, no R/G Lungs: faint rhonchi throughout, slightly tachypneic  Abdomen: +BS x4, obese, soft, non tender, non distended  Musculoskeletal: normal bulk and tone, no edema  Skin: intact no rashes or lesions   Recent Labs  Lab 04/12/2018 1740 04/19/18 0425  NA 138 142  K 3.5 3.4*  CL 105 111  CO2 25 25  BUN 21 17  CREATININE 1.35* 1.08*  GLUCOSE 135* 107*   Recent Labs  Lab 03/30/2018 1740 04/19/18 0425  HGB 11.7* 10.8*  HCT 34.9* 32.1*  WBC 21.9* 20.5*  PLT 204 170   Dg Chest 2 View  Result Date: 04/26/2018 CLINICAL DATA:  Chest pain and short of breath EXAM: CHEST - 2 VIEW COMPARISON:  01/24/2013 FINDINGS: Hyperinflation. There are small pleural effusions. Diffuse bilateral coarse interstitial opacity with patchy peripheral ground-glass opacity in the bilateral right greater than left lung bases. Normal heart size. No pneumothorax. IMPRESSION: 1. Small pleural effusions 2. Diffuse increased interstitial opacity with mild peripheral ground-glass opacity in the right greater than left lung bases. Suspect that there may be some component of underlying interstitial lung disease. Findings suspected to represent acute inflammatory process or edema on underlying chronic change. Electronically Signed   By: Donavan Foil M.D.   On: 04/21/2018 18:32  Ct Angio Chest Pe W Or Wo Contrast  Result Date: 04/19/2018 CLINICAL DATA:  Shortness of breath. Hypoxia. Clinical concern for pulmonary embolism. EXAM: CT ANGIOGRAPHY CHEST WITH CONTRAST TECHNIQUE: Multidetector CT imaging of the chest was performed using the standard protocol during bolus administration of intravenous contrast.  Multiplanar CT image reconstructions and MIPs were obtained to evaluate the vascular anatomy. CONTRAST:  17mL ISOVUE-370 IOPAMIDOL (ISOVUE-370) INJECTION 76% COMPARISON:  Chest radiographs obtained yesterday. Chest CT dated 08/17/2005. FINDINGS: Cardiovascular: Atheromatous calcifications, including the coronary arteries and aorta. Normally opacified pulmonary arteries with no pulmonary arterial filling defects. Mediastinum/Nodes: Interval mildly enlarged mediastinal and bilateral hilar lymph nodes. These include a left anterior superior mediastinal node with a short axis diameter of 17 mm on image number 19 series 4, right paratracheal node with a short axis diameter of 11 mm on image number 32 series 4, right hilar node with a short axis diameter of 12 mm on image number 46 series for and left hilar node with a short axis diameter of 11 mm on image number 45 series 4. No enlarged axillary or upper abdominal nodes. 9 mm left lobe thyroid nodule.  Small hiatal hernia. Lungs/Pleura: Small bilateral pleural effusions. Mild bilateral dependent atelectasis. Extensive patchy interstitial prominence in both lungs, greater on the right. There are multiple small to moderate areas of more confluent opacity in the right middle lobe. These are rounded and oval in configuration. Upper Abdomen: Unremarkable. Musculoskeletal: Thoracic and lower cervical spine degenerative changes. Review of the MIP images confirms the above findings. IMPRESSION: 1. Multiple rounded and oval confluent opacities in the right middle lobe, suspicious for multifocal pneumonia. A follow-up chest CT is recommended in 3 months to exclude an underlying mass. 2. Patchy interstitial prominence in both lungs. This is compatible with interstitial pneumonitis with a possible element of interstitial pulmonary edema. 3. Small bilateral pleural effusions with mild bilateral dependent atelectasis. 4. Mild mediastinal and bilateral hilar adenopathy. This is most  likely reactive. Metastatic adenopathy is less likely. 5. No pulmonary emboli seen. 6. Small hiatal hernia. 7. 9 mm left lobe thyroid nodule, too small to characterize, but most likely benign in the absence of known clinical risk factors for thyroid carcinoma. 8.  Calcific coronary artery and aortic atherosclerosis. Aortic Atherosclerosis (ICD10-I70.0). Electronically Signed   By: Claudie Revering M.D.   On: 04/19/2018 16:11    ASSESSMENT / PLAN: Acute hypoxic respiratory failure secondary to multifocal pneumonia vs. interstitial pneumonitis  Acute renal failure  Hypokalemia  Anemia without obvious acute blood loss Hx: Meningioma, HTN, Hyperlipidemia, Diabetes Mellitus, and CVA P: Prn Bipap for dyspnea and/or hypoxia  Scheduled and prn bronchodilator therapy Will start iv and nebulized steroids  Pulmonary hygiene  Continuous telemetry monitoring  Continue outpatient cardiac medications  Trend BMP Replace electrolytes as indicated Monitor UOP  Trend WBC and monitor fever curve  Trend PCT  Follow cultures  Continue current abx  VTE px: subq lovenox Trend CBC Monitor for s/sx of bleeding and transfuse for hgb <7 SSI   Marda Stalker, [place] Pager [phone] (please enter 7 digits) PCCM Consult Pager [phone] (please enter 7 digits)

## 2018-04-19 NOTE — Progress Notes (Signed)
I was called as patient had worsening in shortness of breath and requiring higher oxygen.  Patient has more chest tightness with some pain on the right side of the chest. She is not able to cough up any sputum but have dry cough.  On exam continue diffuse crackling bilateral with some wheezing. Using accessory muscles of respiration and requiring 6 L oxygen via nasal cannula.  Vitals reviewed.  Assessment and plan  *Acute respiratory failure with worsening Requiring 6 L oxygen now.  get CT scan pulmonary angiogram to rule out PE Reviewed CT scan.  No PE. Diffuse and multilobar pneumonia. Change antibiotic to vancomycin and cefepime. Encouraged to use flutter valve and get sputum culture. DuoNeb every 4 hours as needed. Pulmonary consult. Awaited results of echocardiogram.  Remains DNR.  Additional critical care time spent 32 minutes.  Discussed with patient and her son in the room again.

## 2018-04-19 NOTE — Plan of Care (Signed)

## 2018-04-19 NOTE — Evaluation (Signed)
Physical Therapy Evaluation Patient Details Name: Natasha Bentley MRN: [id number] DOB: [birth year] Today's Date: 04/19/2018   History of Present Illness  pt admitted to hospital on 04/16/2018 with complaints of progressive SOB over the past 3 days. Pt subsequently diagnosed with pneumonia. Pt has a past medical history that includes anemia, CVA (30 years ago), DM, HTN, meninginoma, and self reported neuropathy in her legs.    Clinical Impression  Pt is a pleasant 83 year old female who was admitted for pneumonia. Pt performs bed mobility, transfers, and ambulation with CGA. Pt demonstrates deficits with unsteadiness on feet, strength, as well as respiratory issues limiting mobility. Upon arrival pt on 2L of O2 via [place], O2 saturation 79% pts O2 increased to 4L when pts saturation is 91%. Pt CGA amb to bathroom where she was able to perform toileting activites with close supervision. Pt returned to chair beside bed where O2 saturation had decreased to 83% and did not increase until O2 was increased to 5L. PT attempted to wean O2 back down however unable due to pts saturation decreasing. Pt left up in chair on 5L via [place], with saturation 91%. ECG present in room upon completion of session. Pt amb without AD however appears unsteady on feet likely due to neuropathy in feet. Pts strength decreased however WFL bilaterally. Pt unable to sense light touch on either foot which she states is baseline. Pt could benefit from continued skilled therapy at this time to improve deficits toward PLOF. PT will continue to work with pt at least 2x/week while admitted. D/c recommendations at this time are home with home health PT to increase functional mobility and decrease risk of falls.      Follow Up Recommendations Home health PT    Equipment Recommendations       Recommendations for Other Services       Precautions / Restrictions Precautions Precautions: None Restrictions Weight Bearing Restrictions: No       Mobility  Bed Mobility Overal bed mobility: Independent             General bed mobility comments: pt independently performs bed mobility in appropriate time  Transfers Overall transfer level: Needs assistance Equipment used: None Transfers: Sit to/from Stand Sit to Stand: Min guard;From elevated surface         General transfer comment: pt performs sit<>stand transfers with CGA safely without AD. Pt requires elevated surface to perform and states that she can not get up from low surfaces due to neuropathy in legs and has been that way for many years.  Ambulation/Gait Ambulation/Gait assistance: Min guard Gait Distance (Feet): 30 Feet Assistive device: None Gait Pattern/deviations: Step-through pattern;Decreased step length - right;Decreased step length - left     General Gait Details: pt amb 30' total with CGA and no AD. Pt had one seated rest break half way. Pt demonstreates slow gait with decreased stride length however has no LOB despite appearing unsteady on feet. Unsteadiness on feet likely due to neuropathy  Stairs            Wheelchair Mobility    Modified Rankin (Stroke Patients Only)       Balance Overall balance assessment: Needs assistance   Sitting balance-Leahy Scale: Good Sitting balance - Comments: sits EOB without UE support and B feet flat on floor for long periods of time with no gross LOB     Standing balance-Leahy Scale: Good Standing balance comment: Stands without UE support and CGA with no gross LOB. No  LOB during amb.                             Pertinent Vitals/Pain Pain Assessment: No/denies pain    Home Living Family/patient expects to be discharged to:: Private residence Living Arrangements: Spouse/significant other Available Help at Discharge: Family Type of Home: House Home Access: Stairs to enter Entrance Stairs-Rails: Psychiatric nurse of Steps: 4 Home Layout: One level Home Equipment:  Environmental consultant - 2 wheels;Cane - single point;Walker - 4 wheels      Prior Function Level of Independence: Independent         Comments: pt states that she was independent prior to admission and states that she was able to ambulate [place] distances only being limited due to LE neuropathy in her legs     Hand Dominance        Extremity/Trunk Assessment   Upper Extremity Assessment Upper Extremity Assessment: Overall WFL for tasks assessed    Lower Extremity Assessment Lower Extremity Assessment: Overall WFL for tasks assessed(grossly at least 4-/5; 4/5 hip flexion, knee flex/ext, DF)       Communication   Communication: No difficulties  Cognition Arousal/Alertness: Awake/alert Behavior During Therapy: WFL for tasks assessed/performed Overall Cognitive Status: Within Functional Limits for tasks assessed                                        General Comments      Exercises Other Exercises Other Exercises: pt assisted in toileting requiring CGA and no AD to amb 30' total to and from bathroom. Able to perform toileting and hygeine with close supervision. Pt requires increased UE support to transfer on and off of commode   Assessment/Plan    PT Assessment Patient needs continued PT services  PT Problem List Decreased strength;Decreased range of motion;Decreased activity tolerance;Decreased balance;Decreased mobility;Decreased coordination;Decreased cognition;Decreased knowledge of use of DME;Decreased safety awareness;Cardiopulmonary status limiting activity       PT Treatment Interventions DME instruction;Gait training;Stair training;Functional mobility training;Therapeutic activities;Therapeutic exercise;Balance training;Neuromuscular re-education;Patient/family education    PT Goals (Current goals can be found in the Care Plan section)  Acute Rehab PT Goals Patient Stated Goal: return home and function at [facility] PT Goal Formulation: With patient Time  For Goal Achievement: 05/03/18 Potential to Achieve Goals: Good    Frequency Min 2X/week   Barriers to discharge        Co-evaluation               AM-PAC PT "6 Clicks" Daily Activity  Outcome Measure Difficulty turning over in bed (including adjusting bedclothes, sheets and blankets)?: None Difficulty moving from lying on back to sitting on the side of the bed? : None Difficulty sitting down on and standing up from a chair with arms (e.g., wheelchair, bedside commode, etc,.)?: Unable Help needed moving to and from a bed to chair (including a wheelchair)?: A Little Help needed walking in hospital room?: A Little Help needed climbing 3-5 steps with a railing? : A Little 6 Click Score: 18    End of Session Equipment Utilized During Treatment: Gait belt Activity Tolerance: (Limited due to unstable O2 saturation) Patient left: in chair;with call bell/phone within reach;with chair alarm set(ECG in room) Nurse Communication: Other (comment)(O2 saturation) PT Visit Diagnosis: Unsteadiness on feet (R26.81);Other abnormalities of gait and mobility (R26.89);Muscle weakness (generalized) (M62.81);Difficulty in walking,  not elsewhere classified (R26.2)    Time: 4136-4383 PT Time Calculation (min) (ACUTE ONLY): 29 min   Charges:         PT G Codes:        Natasha Bentley, SPT   Natasha Bentley 04/19/2018, 10:42 AM

## 2018-04-19 NOTE — Plan of Care (Signed)
?  Problem: Clinical Measurements: ?Goal: Ability to maintain a body temperature in the normal range will improve ?Outcome: Progressing ?  ?Problem: Respiratory: ?Goal: Ability to maintain adequate ventilation will improve ?Outcome: Progressing ?  ?Problem: Respiratory: ?Goal: Ability to maintain a clear airway will improve ?Outcome: Progressing ?  ?

## 2018-04-19 NOTE — Progress Notes (Signed)
*  PRELIMINARY RESULTS* Echocardiogram 2D Echocardiogram has been performed.  Natasha Bentley 04/19/2018, 10:21 AM

## 2018-04-19 NOTE — Progress Notes (Signed)
Pharmacy Antibiotic Note  VARIE MACHAMER is a 83 y.o. female admitted on 04/01/2018 with sepsis.  Pharmacy has been consulted for Cefepime and Vancomycin dosing.  Plan: Vancomycin 1250 mg IV every 18 hours.  Goal trough 15-20 mcg/mL. Will check a Vancomycin trough prior to 5th dose.  Cefepime 2g IV q12h  Height: 5\' 10"  (177.8 cm) Weight: 208 lb 11.2 oz (94.7 kg) IBW/kg (Calculated) : 68.5  Temp (24hrs), Avg:98.9 F (37.2 C), Min:98.3 F (36.8 C), Max:99.2 F (37.3 C)  Recent Labs  Lab 03/30/2018 1740 04/22/2018 2047 04/19/18 0425  WBC 21.9*  --  20.5*  CREATININE 1.35*  --  1.08*  LATICACIDVEN 2.1* 1.1  --     Estimated Creatinine Clearance: 50.9 mL/min (A) (by C-G formula based on SCr of 1.08 mg/dL (H)).    Allergies  Allergen Reactions  . Etodolac Diarrhea  . Sulfa Antibiotics     Thank you for allowing pharmacy to be a part of this patient's care.  Paulina Fusi, PharmD, BCPS 04/19/2018 7:31 PM

## 2018-04-19 NOTE — Progress Notes (Signed)
[facility] [place] NAME: Natasha Bentley    MR#:  [id number]  DATE OF BIRTH:  01/16/1935  SUBJECTIVE:  CHIEF COMPLAINT:   Chief Complaint  Patient presents with  . Shortness of Breath   Came with shortness of breath and hypoxia with cough.  Noted to have pneumonia.  Blood pressure was running slightly low on admission but stable now. After minimal exertion today her saturations started dropping and we had to increase the oxygen supply. REVIEW OF SYSTEMS:  CONSTITUTIONAL: No fever, fatigue or weakness.  EYES: No blurred or double vision.  EARS, NOSE, AND THROAT: No tinnitus or ear pain.  RESPIRATORY: Have cough, shortness of breath, no wheezing or hemoptysis.  CARDIOVASCULAR: No chest pain, orthopnea, edema.  GASTROINTESTINAL: No nausea, vomiting, diarrhea or abdominal pain.  GENITOURINARY: No dysuria, hematuria.  ENDOCRINE: No polyuria, nocturia,  HEMATOLOGY: No anemia, easy bruising or bleeding SKIN: No rash or lesion. MUSCULOSKELETAL: No joint pain or arthritis.   NEUROLOGIC: No tingling, numbness, weakness.  PSYCHIATRY: No anxiety or depression.   ROS  DRUG ALLERGIES:   Allergies  Allergen Reactions  . Etodolac Diarrhea  . Sulfa Antibiotics     VITALS:  Blood pressure 137/60, pulse (!) 102, temperature 98.3 F (36.8 C), temperature source Oral, resp. rate 20, height 5\' 10"  (1.778 m), weight 94.7 kg (208 lb 11.2 oz), SpO2 92 %.  PHYSICAL EXAMINATION:  GENERAL:  83 y.o.-year-old patient lying in the bed with no acute distress.  EYES: Pupils equal, round, reactive to light and accommodation. No scleral icterus. Extraocular muscles intact.  HEENT: Head atraumatic, normocephalic. Oropharynx and nasopharynx clear.  NECK:  Supple, no jugular venous distention. No thyroid enlargement, no tenderness.  LUNGS: Normal breath sounds bilaterally, no wheezing, have crepitation. No use of accessory muscles of respiration.   CARDIOVASCULAR: S1, S2 normal. No murmurs, rubs, or gallops.  ABDOMEN: Soft, nontender, nondistended. Bowel sounds present. No organomegaly or mass.  EXTREMITIES: No pedal edema, cyanosis, or clubbing.  NEUROLOGIC: Cranial nerves II through XII are intact. Muscle strength 5/5 in all extremities. Sensation intact. Gait not checked.  PSYCHIATRIC: The patient is alert and oriented x 3.  SKIN: No obvious rash, lesion, or ulcer.   Physical Exam LABORATORY PANEL:   CBC Recent Labs  Lab 04/19/18 0425  WBC 20.5*  HGB 10.8*  HCT 32.1*  PLT 170   ------------------------------------------------------------------------------------------------------------------  Chemistries  Recent Labs  Lab 04/08/2018 1740 04/19/18 0425  NA 138 142  K 3.5 3.4*  CL 105 111  CO2 25 25  GLUCOSE 135* 107*  BUN 21 17  CREATININE 1.35* 1.08*  CALCIUM 8.7* 8.4*  AST 15  --   ALT 14  --   ALKPHOS 84  --   BILITOT 1.0  --    ------------------------------------------------------------------------------------------------------------------  Cardiac Enzymes Recent Labs  Lab 04/13/2018 1742  TROPONINI <0.03   ------------------------------------------------------------------------------------------------------------------  RADIOLOGY:  Dg Chest 2 View  Result Date: 04/13/2018 CLINICAL DATA:  Chest pain and short of breath EXAM: CHEST - 2 VIEW COMPARISON:  01/24/2013 FINDINGS: Hyperinflation. There are small pleural effusions. Diffuse bilateral coarse interstitial opacity with patchy peripheral ground-glass opacity in the bilateral right greater than left lung bases. Normal heart size. No pneumothorax. IMPRESSION: 1. Small pleural effusions 2. Diffuse increased interstitial opacity with mild peripheral ground-glass opacity in the right greater than left lung bases. Suspect that there may be some component of underlying interstitial lung disease. Findings suspected to represent acute inflammatory process or  edema on underlying chronic change. Electronically Signed   By: Donavan Foil M.D.   On: 03/27/2018 18:32    ASSESSMENT AND PLAN:   Active Problems:   Pneumonia   83 year old female patient with history of diabetes mellitus, hypertension, hyperlipidemia presented to the emergency room for cough, fever and shortness of breath  -Community-acquired pneumonia  on IV Rocephin and Zithromax antibiotics Follow-up cultures Advised to use incentive spirometer and flutter while and will give lozenges to help with coughing.  -Acute respiratory failure with hypoxia Continue supplemental oxygen. Try to taper.  -Hypertension Held home medication on admission because of borderline blood pressure, resume now his blood pressure is stable.  -Type 2 diabetes mellitus Diabetic diet with sliding scale coverage with insulin  -DVT prophylaxis subcu Lovenox daily  -Small pleural effusions could be secondary to pneumonia Check echocardiogram to assess LV function-  Done, awaited report.     All the records are reviewed and case discussed with Care Management/Social Workerr. Management plans discussed with the patient, family and they are in agreement.  CODE STATUS: DNR  TOTAL TIME TAKING CARE OF THIS PATIENT: 35 minutes.   Spoke with her son and husband in the room.  POSSIBLE D/C IN 1-2 DAYS, DEPENDING ON CLINICAL CONDITION.   Natasha Bentley M.D on 04/19/2018   Between 7am to 6pm - Pager - [phone]  After 6pm go to [email or web address] - password [email or web address] [facility]  Office  [phone]  CC: Primary care physician; Natasha Aus, MD  Note: This dictation was prepared with Dragon dictation along with smaller phrase technology. Any transcriptional errors that result from this process are unintentional.

## 2018-04-20 DIAGNOSIS — J189 Pneumonia, unspecified organism: Principal | ICD-10-CM

## 2018-04-20 DIAGNOSIS — J9601 Acute respiratory failure with hypoxia: Secondary | ICD-10-CM

## 2018-04-20 DIAGNOSIS — Z7189 Other specified counseling: Secondary | ICD-10-CM

## 2018-04-20 LAB — BASIC METABOLIC PANEL
Anion gap: 12 (ref 5–15)
BUN: 19 mg/dL (ref 8–23)
CO2: 23 mmol/L (ref 22–32)
CREATININE: 1.41 mg/dL — AB (ref 0.44–1.00)
Calcium: 8.7 mg/dL — ABNORMAL LOW (ref 8.9–10.3)
Chloride: 109 mmol/L (ref 98–111)
GFR calc Af Amer: 39 mL/min — ABNORMAL LOW (ref >60)
GFR calc non Af Amer: 34 mL/min — ABNORMAL LOW (ref >60)
GLUCOSE: 157 mg/dL — AB (ref 70–99)
Potassium: 3.3 mmol/L — ABNORMAL LOW (ref 3.5–5.1)
Sodium: 144 mmol/L (ref 135–145)

## 2018-04-20 LAB — GLUCOSE, CAPILLARY
GLUCOSE-CAPILLARY: 115 mg/dL — AB (ref 70–99)
Glucose-Capillary: 117 mg/dL — ABNORMAL HIGH (ref 70–99)
Glucose-Capillary: 137 mg/dL — ABNORMAL HIGH (ref 70–99)
Glucose-Capillary: 154 mg/dL — ABNORMAL HIGH (ref 70–99)
Glucose-Capillary: 157 mg/dL — ABNORMAL HIGH (ref 70–99)
Glucose-Capillary: 164 mg/dL — ABNORMAL HIGH (ref 70–99)

## 2018-04-20 LAB — CBC
HEMATOCRIT: 34.4 % — AB (ref 35.0–47.0)
Hemoglobin: 11.4 g/dL — ABNORMAL LOW (ref 12.0–16.0)
MCH: 29.3 pg (ref 26.0–34.0)
MCHC: 33.2 g/dL (ref 32.0–36.0)
MCV: 88.2 fL (ref 80.0–100.0)
Platelets: 221 10*3/uL (ref 150–440)
RBC: 3.9 MIL/uL (ref 3.80–5.20)
RDW: 15.4 % — AB (ref 11.5–14.5)
WBC: 23 10*3/uL — ABNORMAL HIGH (ref 3.6–11.0)

## 2018-04-20 LAB — STREP PNEUMONIAE URINARY ANTIGEN: Strep Pneumo Urinary Antigen: NEGATIVE

## 2018-04-20 LAB — ECHOCARDIOGRAM COMPLETE
HEIGHTINCHES: 70 in
Weight: 3339.2 oz

## 2018-04-20 LAB — HIV ANTIBODY (ROUTINE TESTING W REFLEX): HIV Screen 4th Generation wRfx: NONREACTIVE

## 2018-04-20 LAB — MAGNESIUM: MAGNESIUM: 1.9 mg/dL (ref 1.7–2.4)

## 2018-04-20 MED ORDER — POTASSIUM CHLORIDE 10 MEQ/100ML IV SOLN
10.0000 meq | INTRAVENOUS | Status: AC
  Administered 2018-04-20 (×2): 10 meq via INTRAVENOUS
  Filled 2018-04-20 (×2): qty 100

## 2018-04-20 MED ORDER — BUDESONIDE 0.25 MG/2ML IN SUSP
0.2500 mg | Freq: Two times a day (BID) | RESPIRATORY_TRACT | Status: DC
  Administered 2018-04-20 – 2018-04-28 (×18): 0.25 mg via RESPIRATORY_TRACT
  Filled 2018-04-20 (×18): qty 2

## 2018-04-20 MED ORDER — IPRATROPIUM-ALBUTEROL 0.5-2.5 (3) MG/3ML IN SOLN
3.0000 mL | Freq: Four times a day (QID) | RESPIRATORY_TRACT | Status: DC | PRN
  Administered 2018-04-21 – 2018-04-28 (×4): 3 mL via RESPIRATORY_TRACT
  Filled 2018-04-20 (×4): qty 3

## 2018-04-20 MED ORDER — METHYLPREDNISOLONE SODIUM SUCC 40 MG IJ SOLR
40.0000 mg | Freq: Two times a day (BID) | INTRAMUSCULAR | Status: DC
Start: 2018-04-20 — End: 2018-04-27
  Administered 2018-04-20 – 2018-04-26 (×15): 40 mg via INTRAVENOUS
  Filled 2018-04-20 (×15): qty 1

## 2018-04-20 MED ORDER — IPRATROPIUM-ALBUTEROL 0.5-2.5 (3) MG/3ML IN SOLN: 3.0000 mL | Freq: Four times a day (QID) | RESPIRATORY_TRACT | Status: DC | PRN

## 2018-04-20 MED ORDER — POTASSIUM CL IN DEXTROSE 5% 20 MEQ/L IV SOLN
20.0000 meq | INTRAVENOUS | Status: DC
  Administered 2018-04-20 – 2018-04-21 (×2): 20 meq via INTRAVENOUS
  Filled 2018-04-20 (×4): qty 1000

## 2018-04-20 MED ORDER — HYDROCOD POLST-CPM POLST ER 10-8 MG/5ML PO SUER
5.0000 mL | Freq: Two times a day (BID) | ORAL | Status: DC | PRN
  Administered 2018-04-20 – 2018-04-21 (×2): 5 mL via ORAL
  Filled 2018-04-20 (×2): qty 5

## 2018-04-20 MED ORDER — SODIUM CHLORIDE 0.9 % IV SOLN
500.0000 mg | Freq: Every day | INTRAVENOUS | Status: AC
  Administered 2018-04-20 – 2018-04-24 (×5): 500 mg via INTRAVENOUS
  Filled 2018-04-20 (×5): qty 500

## 2018-04-20 NOTE — Progress Notes (Signed)
PT Cancellation Note  Patient Details Name: Natasha Bentley MRN: [id number] DOB: [birth year]   Cancelled Treatment:    Reason Eval/Treat Not Completed: Other (comment). Noted pt with respiratory distress with pt now on bipap. Due to change in status with transfer to CCU, will complete current therapy orders. Please re-order when pt medically appropriate for resumption of PT services.    [name] 04/20/2018, 8:34 AM Greggory Stallion, PT, DPT [phone]

## 2018-04-20 NOTE — Progress Notes (Signed)
Transitioned from BiPAP to HFNC this AM. Comfortable but requiring 100% FiO2. Cough is NP. Denies pain  Vitals:   04/20/18 1100 04/20/18 1200 04/20/18 1300 04/20/18 1400  BP: 100/82 (!) 137/58 (!) 131/58   Pulse: 91 91 86   Resp: 20 (!) 25 (!) 29   Temp:    97.9 F (36.6 C)  TempSrc:    Oral  SpO2: (!) 87% 90% (!) 86%   Weight:      Height:        Gen: WDWN in NAD HEENT: NCAT, sclerae white, oropharynx normal Neck: No LAN, no JVD noted Lungs: full BS, coarse crackles, no wheezes Cardiovascular: Regular, normal rate, no M noted Abdomen: Soft, NT, +BS Ext: no C/C/E Neuro: PERRL, EOMI, motor/sensory grossly intact Skin: No lesions noted  BMP Latest Ref Rng & Units 04/20/2018 04/19/2018 04/21/2018  Glucose 70 - 99 mg/dL 157(H) 107(H) 135(H)  BUN 8 - 23 mg/dL 19 17 21   Creatinine 0.44 - 1.00 mg/dL 1.41(H) 1.08(H) 1.35(H)  Sodium 135 - 145 mmol/L 144 142 138  Potassium 3.5 - 5.1 mmol/L 3.3(L) 3.4(L) 3.5  Chloride 98 - 111 mmol/L 109 111 105  CO2 22 - 32 mmol/L 23 25 25   Calcium 8.9 - 10.3 mg/dL 8.7(L) 8.4(L) 8.7(L)   CBC Latest Ref Rng & Units 04/20/2018 04/19/2018 04/08/2018  WBC 3.6 - 11.0 K/uL 23.0(H) 20.5(H) 21.9(H)  Hemoglobin 12.0 - 16.0 g/dL 11.4(L) 10.8(L) 11.7(L)  Hematocrit 35.0 - 47.0 % 34.4(L) 32.1(L) 34.9(L)  Platelets 150 - 440 K/uL 221 170 204   CXR: no new film  CT chest reviewed by me  IMPRESSION: Severe hypoxemic respiratory failure Suspected CAP ALI/pneumonitis/atypical pneumonia pattern on CXR AKI, nonoliguric Mild hypokalemia DM 2 Steroid exacerbated hyperglycemia  Despite severity of hypoxemia, she is tolerating it well  PLAN/REC: Cont BiPAP <> HFNC Cont O2 to maintain SpO2 > 90% Changed abx to Cefepime + azithro Legionella and strep antigens ordered Cont SSI IVFs adjusted   I discussed goals of care emphasizing that whatever this acute respiratory process is, it is likely reversible. She was active and highly functional prior to the onset of  this illness. Therefore, I have changed code status to LIMITED CODE to include short term intubation if needed (no trach or long term vent)  Merton Border, MD PCCM service Mobile [phone] Pager [phone] 04/20/2018 4:09 PM

## 2018-04-20 NOTE — Progress Notes (Signed)
[facility] [place] NAME: Natasha Bentley    MR#:  [id number]  DATE OF BIRTH:  [birth year]  SUBJECTIVE:  CHIEF COMPLAINT:   Chief Complaint  Patient presents with  . Shortness of Breath   Transferred to stepdown unit on BiPAP overnight due to worsening respirations.  Saturations were 80s on nonrebreather mask. REVIEW OF SYSTEMS:  CONSTITUTIONAL: Fatigue present EYES: No blurred or double vision.  EARS, NOSE, AND THROAT: No tinnitus or ear pain.  RESPIRATORY: Has shortness of breath and dry cough.. Chronic orthopnea. CARDIOVASCULAR: No chest pain, orthopnea, edema.  GASTROINTESTINAL: No nausea, vomiting, diarrhea or abdominal pain.  GENITOURINARY: No dysuria, hematuria.  ENDOCRINE: No polyuria, nocturia,  HEMATOLOGY: No anemia, easy bruising or bleeding SKIN: No rash or lesion. MUSCULOSKELETAL: No joint pain or arthritis.   NEUROLOGIC: No tingling, numbness, weakness.  PSYCHIATRY: No anxiety or depression.   ROS  DRUG ALLERGIES:   Allergies  Allergen Reactions  . Etodolac Diarrhea  . Sulfa Antibiotics     VITALS:  Blood pressure (!) 131/58, pulse 86, temperature 99 F (37.2 C), temperature source Axillary, resp. rate (!) 29, height 5\' 10"  (1.778 m), weight 94.7 kg (208 lb 11.2 oz), SpO2 (!) 86 %.  PHYSICAL EXAMINATION:  GENERAL:  83 y.o.-year-old patient lying in the bed.  Looks critically ill EYES: Pupils equal, round, reactive to light and accommodation. No scleral icterus. Extraocular muscles intact.  HEENT: Head atraumatic, normocephalic. Oropharynx and nasopharynx clear.  NECK:  Supple, no jugular venous distention. No thyroid enlargement, no tenderness.  LUNGS: Bilateral coarse breath sounds and wheezing CARDIOVASCULAR: S1, S2 normal. No murmurs, rubs, or gallops.  ABDOMEN: Soft, nontender, nondistended. Bowel sounds present. No organomegaly or mass.  EXTREMITIES: No pedal edema, cyanosis, or clubbing.  NEUROLOGIC:  Moves all 4 extremities equally PSYCHIATRIC: The patient is alert and oriented x 3.  SKIN: No obvious rash, lesion, or ulcer.   Physical Exam LABORATORY PANEL:   CBC Recent Labs  Lab 04/20/18 0450  WBC 23.0*  HGB 11.4*  HCT 34.4*  PLT 221   ------------------------------------------------------------------------------------------------------------------  Chemistries  Recent Labs  Lab 04/08/2018 1740  04/20/18 0450  NA 138   < > 144  K 3.5   < > 3.3*  CL 105   < > 109  CO2 25   < > 23  GLUCOSE 135*   < > 157*  BUN 21   < > 19  CREATININE 1.35*   < > 1.41*  CALCIUM 8.7*   < > 8.7*  MG  --   --  1.9  AST 15  --   --   ALT 14  --   --   ALKPHOS 84  --   --   BILITOT 1.0  --   --    < > = values in this interval not displayed.   ------------------------------------------------------------------------------------------------------------------  Cardiac Enzymes Recent Labs  Lab 04/20/2018 1742  TROPONINI <0.03   ------------------------------------------------------------------------------------------------------------------  RADIOLOGY:  Dg Chest 2 View  Result Date: 03/27/2018 CLINICAL DATA:  Chest pain and short of breath EXAM: CHEST - 2 VIEW COMPARISON:  01/24/2013 FINDINGS: Hyperinflation. There are small pleural effusions. Diffuse bilateral coarse interstitial opacity with patchy peripheral ground-glass opacity in the bilateral right greater than left lung bases. Normal heart size. No pneumothorax. IMPRESSION: 1. Small pleural effusions 2. Diffuse increased interstitial opacity with mild peripheral ground-glass opacity in the right greater than left lung bases. Suspect that there may be some component  of underlying interstitial lung disease. Findings suspected to represent acute inflammatory process or edema on underlying chronic change. Electronically Signed   By: Donavan Foil M.D.   On: 04/03/2018 18:32   Ct Angio Chest Pe W Or Wo Contrast  Result Date:  04/19/2018 CLINICAL DATA:  Shortness of breath. Hypoxia. Clinical concern for pulmonary embolism. EXAM: CT ANGIOGRAPHY CHEST WITH CONTRAST TECHNIQUE: Multidetector CT imaging of the chest was performed using the standard protocol during bolus administration of intravenous contrast. Multiplanar CT image reconstructions and MIPs were obtained to evaluate the vascular anatomy. CONTRAST:  1mL ISOVUE-370 IOPAMIDOL (ISOVUE-370) INJECTION 76% COMPARISON:  Chest radiographs obtained yesterday. Chest CT dated 08/17/2005. FINDINGS: Cardiovascular: Atheromatous calcifications, including the coronary arteries and aorta. Normally opacified pulmonary arteries with no pulmonary arterial filling defects. Mediastinum/Nodes: Interval mildly enlarged mediastinal and bilateral hilar lymph nodes. These include a left anterior superior mediastinal node with a short axis diameter of 17 mm on image number 19 series 4, right paratracheal node with a short axis diameter of 11 mm on image number 32 series 4, right hilar node with a short axis diameter of 12 mm on image number 46 series for and left hilar node with a short axis diameter of 11 mm on image number 45 series 4. No enlarged axillary or upper abdominal nodes. 9 mm left lobe thyroid nodule.  Small hiatal hernia. Lungs/Pleura: Small bilateral pleural effusions. Mild bilateral dependent atelectasis. Extensive patchy interstitial prominence in both lungs, greater on the right. There are multiple small to moderate areas of more confluent opacity in the right middle lobe. These are rounded and oval in configuration. Upper Abdomen: Unremarkable. Musculoskeletal: Thoracic and lower cervical spine degenerative changes. Review of the MIP images confirms the above findings. IMPRESSION: 1. Multiple rounded and oval confluent opacities in the right middle lobe, suspicious for multifocal pneumonia. A follow-up chest CT is recommended in 3 months to exclude an underlying mass. 2. Patchy  interstitial prominence in both lungs. This is compatible with interstitial pneumonitis with a possible element of interstitial pulmonary edema. 3. Small bilateral pleural effusions with mild bilateral dependent atelectasis. 4. Mild mediastinal and bilateral hilar adenopathy. This is most likely reactive. Metastatic adenopathy is less likely. 5. No pulmonary emboli seen. 6. Small hiatal hernia. 7. 9 mm left lobe thyroid nodule, too small to characterize, but most likely benign in the absence of known clinical risk factors for thyroid carcinoma. 8.  Calcific coronary artery and aortic atherosclerosis. Aortic Atherosclerosis (ICD10-I70.0). Electronically Signed   By: Claudie Revering M.D.   On: 04/19/2018 16:11    ASSESSMENT AND PLAN:   Active Problems:   Pneumonia   83 year old female patient with history of diabetes mellitus, hypertension, hyperlipidemia presented to the emergency room for cough, fever and shortness of breath  *Bilateral pneumonia.  Community-acquired. Due to being critically ill patient is on broad-spectrum antibiotics.  Discussed with Dr. Alva Garnet.  Add azithromycin. Cultures no growth to date.  -Acute respiratory failure with hypoxia On BiPAP.  Nebulizers as needed.  Wean to nasal cannula as tolerated  -Hypertension Medications held  -Type 2 diabetes mellitus Diabetic diet with sliding scale coverage with insulin  -DVT prophylaxis  Lovenox daily  All the records are reviewed and case discussed with Care Management/Social Workerr. Management plans discussed with the patient, family and they are in agreement.  CODE STATUS: DNR  TOTAL TIME TAKING CARE OF THIS PATIENT: 35 minutes.   Spoke with her son and husband in the room.  POSSIBLE D/C IN  1-2 DAYS, DEPENDING ON CLINICAL CONDITION.  Neita Carp M.D on 04/20/2018   Between 7am to 6pm - Pager - [phone]  After 6pm go to [email or web address] - password [email or web address] [facility]  Office   [phone]  CC: Primary care physician; [name], MD  Note: This dictation was prepared with Dragon dictation along with smaller phrase technology. Any transcriptional errors that result from this process are unintentional.

## 2018-04-21 ENCOUNTER — Inpatient Hospital Stay: Payer: Medicare Other

## 2018-04-21 LAB — CBC
HEMATOCRIT: 37 % (ref 35.0–47.0)
HEMOGLOBIN: 12.1 g/dL (ref 12.0–16.0)
MCH: 28.4 pg (ref 26.0–34.0)
MCHC: 32.6 g/dL (ref 32.0–36.0)
MCV: 87 fL (ref 80.0–100.0)
Platelets: 280 10*3/uL (ref 150–440)
RBC: 4.26 MIL/uL (ref 3.80–5.20)
RDW: 15.6 % — AB (ref 11.5–14.5)
WBC: 20.4 10*3/uL — ABNORMAL HIGH (ref 3.6–11.0)

## 2018-04-21 LAB — LEGIONELLA PNEUMOPHILA SEROGP 1 UR AG: L. pneumophila Serogp 1 Ur Ag: NEGATIVE

## 2018-04-21 LAB — GLUCOSE, CAPILLARY
GLUCOSE-CAPILLARY: 314 mg/dL — AB (ref 70–99)
Glucose-Capillary: 116 mg/dL — ABNORMAL HIGH (ref 70–99)
Glucose-Capillary: 138 mg/dL — ABNORMAL HIGH (ref 70–99)
Glucose-Capillary: 153 mg/dL — ABNORMAL HIGH (ref 70–99)

## 2018-04-21 LAB — BASIC METABOLIC PANEL
ANION GAP: 9 (ref 5–15)
BUN: 28 mg/dL — AB (ref 8–23)
CO2: 26 mmol/L (ref 22–32)
Calcium: 8.8 mg/dL — ABNORMAL LOW (ref 8.9–10.3)
Chloride: 105 mmol/L (ref 98–111)
Creatinine, Ser: 1.21 mg/dL — ABNORMAL HIGH (ref 0.44–1.00)
GFR calc Af Amer: 47 mL/min — ABNORMAL LOW (ref >60)
GFR calc non Af Amer: 41 mL/min — ABNORMAL LOW (ref >60)
GLUCOSE: 145 mg/dL — AB (ref 70–99)
POTASSIUM: 3.4 mmol/L — AB (ref 3.5–5.1)
Sodium: 140 mmol/L (ref 135–145)

## 2018-04-21 LAB — MAGNESIUM: MAGNESIUM: 2.1 mg/dL (ref 1.7–2.4)

## 2018-04-21 MED ORDER — MORPHINE SULFATE (PF) 2 MG/ML IV SOLN
1.0000 mg | INTRAVENOUS | Status: DC | PRN
  Administered 2018-04-21 – 2018-04-22 (×2): 1 mg via INTRAVENOUS
  Filled 2018-04-21 (×2): qty 1

## 2018-04-21 MED ORDER — FUROSEMIDE 10 MG/ML IJ SOLN
INTRAMUSCULAR | Status: AC
  Administered 2018-04-21: 40 mg via INTRAVENOUS
  Filled 2018-04-21: qty 4

## 2018-04-21 MED ORDER — FUROSEMIDE 10 MG/ML IJ SOLN
40.0000 mg | Freq: Once | INTRAMUSCULAR | Status: AC
  Administered 2018-04-21: 40 mg via INTRAVENOUS

## 2018-04-21 MED ORDER — POTASSIUM CHLORIDE 10 MEQ/100ML IV SOLN
10.0000 meq | INTRAVENOUS | Status: AC
  Administered 2018-04-21 (×2): 10 meq via INTRAVENOUS
  Filled 2018-04-21 (×2): qty 100

## 2018-04-21 NOTE — Progress Notes (Addendum)
Over the last 24 hours patient required BiPAP overnight. Secondary to hypoxemia and was not tolerating heated high flow. Presently saturation is 98% wish [place] to try off of BiPAP again.  Vitals:   04/20/18 2200 04/21/18 0200 04/21/18 0300 04/21/18 0400  BP: 135/74 138/68 (!) 154/78 (!) 151/73  Pulse: 78 80 89 82  Resp: (!) 26 (!) 23 (!) 27 (!) 25  Temp:      TempSrc:      SpO2: 94% 92% 97% 94%  Weight:      Height:        Gen: WDWN in NAD HEENT: NCAT, sclerae white, oropharynx normal Neck: No LAN, no JVD noted Lungs: full BS, coarse crackles, no wheezes Cardiovascular: Regular, normal rate, no M noted Abdomen: Soft, NT, +BS Ext: no C/C/E Neuro: PERRL, EOMI, motor/sensory grossly intact Skin: No lesions noted  BMP Latest Ref Rng & Units 04/21/2018 04/20/2018 04/19/2018  Glucose 70 - 99 mg/dL 145(H) 157(H) 107(H)  BUN 8 - 23 mg/dL 28(H) 19 17  Creatinine 0.44 - 1.00 mg/dL 1.21(H) 1.41(H) 1.08(H)  Sodium 135 - 145 mmol/L 140 144 142  Potassium 3.5 - 5.1 mmol/L 3.4(L) 3.3(L) 3.4(L)  Chloride 98 - 111 mmol/L 105 109 111  CO2 22 - 32 mmol/L 26 23 25   Calcium 8.9 - 10.3 mg/dL 8.8(L) 8.7(L) 8.4(L)   CBC Latest Ref Rng & Units 04/21/2018 04/20/2018 04/19/2018  WBC 3.6 - 11.0 K/uL 20.4(H) 23.0(H) 20.5(H)  Hemoglobin 12.0 - 16.0 g/dL 12.1 11.4(L) 10.8(L)  Hematocrit 35.0 - 47.0 % 37.0 34.4(L) 32.1(L)  Platelets 150 - 440 K/uL 280 221 170   CXR: no new film  CT chest reviewed by me  IMPRESSION: Severe hypoxemic respiratory failure Suspected CAP ALI/pneumonitis/atypical pneumonia pattern on CXR AKI, nonoliguric Mild hypokalemia DM 2 Steroid exacerbated hyperglycemia  Despite severity of hypoxemia, she is tolerating it well  PLAN/REC: Cont BiPAP <> HFNC Cont O2 to maintain SpO2 > 90% Changed abx to Cefepime + azithro Legionella pending.  strep antigens negative Cont SSI IVFs adjusted Dose of lasix as BP tolerates  [name], DO  Patient ID: Natasha Bentley, female    DOB: 1934/11/17, 83 y.o.   MRN: [id number]

## 2018-04-21 NOTE — Progress Notes (Signed)
[facility] [place] NAME: Natasha Bentley    MR#:  [id number]  DATE OF BIRTH:  1935-03-25  SUBJECTIVE:  CHIEF COMPLAINT:   Chief Complaint  Patient presents with  . Shortness of Breath   On 100% HFNC  REVIEW OF SYSTEMS:  CONSTITUTIONAL: Fatigue present EYES: No blurred or double vision.  EARS, NOSE, AND THROAT: No tinnitus or ear pain.  RESPIRATORY: Has shortness of breath and dry cough.. Chronic orthopnea. CARDIOVASCULAR: No chest pain, orthopnea, edema.  GASTROINTESTINAL: No nausea, vomiting, diarrhea or abdominal pain.  GENITOURINARY: No dysuria, hematuria.  ENDOCRINE: No polyuria, nocturia,  HEMATOLOGY: No anemia, easy bruising or bleeding SKIN: No rash or lesion. MUSCULOSKELETAL: No joint pain or arthritis.   NEUROLOGIC: No tingling, numbness, weakness.  PSYCHIATRY: No anxiety or depression.   ROS  DRUG ALLERGIES:   Allergies  Allergen Reactions  . Etodolac Diarrhea  . Sulfa Antibiotics     VITALS:  Blood pressure (!) 120/93, pulse 94, temperature 98.3 F (36.8 C), temperature source Axillary, resp. rate (!) 24, height 5\' 10"  (1.778 m), weight 94.7 kg (208 lb 11.2 oz), SpO2 (!) 86 %.  PHYSICAL EXAMINATION:  GENERAL:  83 y.o.-year-old patient lying in the bed.  Looks critically ill EYES: Pupils equal, round, reactive to light and accommodation. No scleral icterus. Extraocular muscles intact.  HEENT: Head atraumatic, normocephalic. Oropharynx and nasopharynx clear.  NECK:  Supple, no jugular venous distention. No thyroid enlargement, no tenderness.  LUNGS: Bilateral coarse breath sounds and wheezing CARDIOVASCULAR: S1, S2 normal. No murmurs, rubs, or gallops.  ABDOMEN: Soft, nontender, nondistended. Bowel sounds present. No organomegaly or mass.  EXTREMITIES: No pedal edema, cyanosis, or clubbing.  NEUROLOGIC: Moves all 4 extremities equally PSYCHIATRIC: The patient is alert and oriented x 3.  SKIN: No obvious rash,  lesion, or ulcer.   Physical Exam LABORATORY PANEL:   CBC Recent Labs  Lab 04/21/18 0413  WBC 20.4*  HGB 12.1  HCT 37.0  PLT 280   ------------------------------------------------------------------------------------------------------------------  Chemistries  Recent Labs  Lab 03/27/2018 1740  04/21/18 0413  NA 138   < > 140  K 3.5   < > 3.4*  CL 105   < > 105  CO2 25   < > 26  GLUCOSE 135*   < > 145*  BUN 21   < > 28*  CREATININE 1.35*   < > 1.21*  CALCIUM 8.7*   < > 8.8*  MG  --    < > 2.1  AST 15  --   --   ALT 14  --   --   ALKPHOS 84  --   --   BILITOT 1.0  --   --    < > = values in this interval not displayed.   ------------------------------------------------------------------------------------------------------------------  Cardiac Enzymes Recent Labs  Lab 04/03/2018 1742  TROPONINI <0.03   ------------------------------------------------------------------------------------------------------------------  RADIOLOGY:  Ct Angio Chest Pe W Or Wo Contrast  Result Date: 04/19/2018 CLINICAL DATA:  Shortness of breath. Hypoxia. Clinical concern for pulmonary embolism. EXAM: CT ANGIOGRAPHY CHEST WITH CONTRAST TECHNIQUE: Multidetector CT imaging of the chest was performed using the standard protocol during bolus administration of intravenous contrast. Multiplanar CT image reconstructions and MIPs were obtained to evaluate the vascular anatomy. CONTRAST:  32mL ISOVUE-370 IOPAMIDOL (ISOVUE-370) INJECTION 76% COMPARISON:  Chest radiographs obtained yesterday. Chest CT dated 08/17/2005. FINDINGS: Cardiovascular: Atheromatous calcifications, including the coronary arteries and aorta. Normally opacified pulmonary arteries with no pulmonary arterial filling  defects. Mediastinum/Nodes: Interval mildly enlarged mediastinal and bilateral hilar lymph nodes. These include a left anterior superior mediastinal node with a short axis diameter of 17 mm on image number 19 series 4, right  paratracheal node with a short axis diameter of 11 mm on image number 32 series 4, right hilar node with a short axis diameter of 12 mm on image number 46 series for and left hilar node with a short axis diameter of 11 mm on image number 45 series 4. No enlarged axillary or upper abdominal nodes. 9 mm left lobe thyroid nodule.  Small hiatal hernia. Lungs/Pleura: Small bilateral pleural effusions. Mild bilateral dependent atelectasis. Extensive patchy interstitial prominence in both lungs, greater on the right. There are multiple small to moderate areas of more confluent opacity in the right middle lobe. These are rounded and oval in configuration. Upper Abdomen: Unremarkable. Musculoskeletal: Thoracic and lower cervical spine degenerative changes. Review of the MIP images confirms the above findings. IMPRESSION: 1. Multiple rounded and oval confluent opacities in the right middle lobe, suspicious for multifocal pneumonia. A follow-up chest CT is recommended in 3 months to exclude an underlying mass. 2. Patchy interstitial prominence in both lungs. This is compatible with interstitial pneumonitis with a possible element of interstitial pulmonary edema. 3. Small bilateral pleural effusions with mild bilateral dependent atelectasis. 4. Mild mediastinal and bilateral hilar adenopathy. This is most likely reactive. Metastatic adenopathy is less likely. 5. No pulmonary emboli seen. 6. Small hiatal hernia. 7. 9 mm left lobe thyroid nodule, too small to characterize, but most likely benign in the absence of known clinical risk factors for thyroid carcinoma. 8.  Calcific coronary artery and aortic atherosclerosis. Aortic Atherosclerosis (ICD10-I70.0). Electronically Signed   By: Claudie Revering M.D.   On: 04/19/2018 16:11   Dg Chest Port 1 View  Result Date: 04/21/2018 CLINICAL DATA:  Respiratory failure EXAM: PORTABLE CHEST 1 VIEW COMPARISON:  04/04/2018 FINDINGS: Shallow inspiration. Increasing diffuse bilateral airspace  and interstitial disease. This may represent edema, multifocal pneumonia, or ARDS. No definite effusions. No pneumothorax. Heart size is obscured by parenchymal process. Calcification of the aorta. IMPRESSION: Increasing bilateral airspace and interstitial disease in the lungs. Electronically Signed   By: Lucienne Capers M.D.   On: 04/21/2018 01:28    ASSESSMENT AND PLAN:   Active Problems:   Pneumonia   83 year old female patient with history of diabetes mellitus, hypertension, hyperlipidemia presented to the emergency room for cough, fever and shortness of breath  *Bilateral pneumonia.  Community-acquired. Cefepime and azithromycin Cultures no growth to date.  -Acute respiratory failure with hypoxia On 100% HFNC Patient is a partial code  -Hypertension Medications held  -Type 2 diabetes mellitus Diabetic diet with sliding scale coverage with insulin  -DVT prophylaxis  Lovenox daily  All the records are reviewed and case discussed with Care Management/Social Workerr. Management plans discussed with the patient, family and they are in agreement.  CODE STATUS: DNR  TOTAL TIME TAKING CARE OF THIS PATIENT: 35 minutes.   POSSIBLE D/C IN 1-2 DAYS, DEPENDING ON CLINICAL CONDITION.  Neita Carp M.D on 04/21/2018   Between 7am to 6pm - Pager - [phone]  After 6pm go to [email or web address] - password [email or web address] [facility]  Office  [phone]  CC: Primary care physician; [name], MD  Note: This dictation was prepared with Dragon dictation along with smaller phrase technology. Any transcriptional errors that result from this process are unintentional.

## 2018-04-21 NOTE — Progress Notes (Signed)
Pharmacy Antibiotic Note  Natasha Bentley is a 83 y.o. female admitted on 03/29/2018 with suspected CAP and severe hypoxemic respiratory failure.  Pharmacy has been consulted for cefepime dosing.  Plan: Continue Cefepime 2g IV Q12hr.   Per ICU rounds 7/26 will continue azithrimycin 500mg  IV Q24hr x 5 doses.   Height: 5\' 10"  (177.8 cm) Weight: 208 lb 11.2 oz (94.7 kg) IBW/kg (Calculated) : 68.5  Temp (24hrs), Avg:98.5 F (36.9 C), Min:98.3 F (36.8 C), Max:98.6 F (37 C)  Recent Labs  Lab 03/27/2018 1740 04/15/2018 2047 04/19/18 0425 04/20/18 0450 04/21/18 0413  WBC 21.9*  --  20.5* 23.0* 20.4*  CREATININE 1.35*  --  1.08* 1.41* 1.21*  LATICACIDVEN 2.1* 1.1  --   --   --     Estimated Creatinine Clearance: 45.5 mL/min (A) (by C-G formula based on SCr of 1.21 mg/dL (H)).    Allergies  Allergen Reactions  . Etodolac Diarrhea  . Sulfa Antibiotics     Antimicrobials this admission: Azithromycin 7/23, 7/25 >> 7/29 Ceftriaxone 7/24 Vancomycin 7/24 >> 7/25 Cefepime 7/24 >>    Dose adjustments this admission: N/A  Microbiology results: 7/23 BCx: no growth x 3 days  7/24 MRSA PCR: negative   Thank you for allowing pharmacy to be a part of this patient's care.  Madysun Thall L 04/21/2018 8:26 PM

## 2018-04-22 ENCOUNTER — Inpatient Hospital Stay: Payer: Self-pay

## 2018-04-22 ENCOUNTER — Inpatient Hospital Stay: Payer: Medicare Other

## 2018-04-22 LAB — GLUCOSE, CAPILLARY
GLUCOSE-CAPILLARY: 174 mg/dL — AB (ref 70–99)
Glucose-Capillary: 125 mg/dL — ABNORMAL HIGH (ref 70–99)
Glucose-Capillary: 153 mg/dL — ABNORMAL HIGH (ref 70–99)
Glucose-Capillary: 106 mg/dL — ABNORMAL HIGH (ref 70–99)

## 2018-04-22 MED ORDER — ALPRAZOLAM 0.25 MG PO TABS
0.2500 mg | ORAL_TABLET | Freq: Three times a day (TID) | ORAL | Status: DC | PRN
  Administered 2018-04-23 – 2018-04-28 (×3): 0.25 mg via ORAL
  Filled 2018-04-22 (×3): qty 1

## 2018-04-22 MED ORDER — IBUPROFEN 400 MG PO TABS
400.0000 mg | ORAL_TABLET | ORAL | Status: DC | PRN
  Administered 2018-04-22: 400 mg via ORAL
  Filled 2018-04-22: qty 1

## 2018-04-22 MED ORDER — FUROSEMIDE 10 MG/ML IJ SOLN
INTRAMUSCULAR | Status: AC
  Administered 2018-04-22: 12:00:00
  Filled 2018-04-22: qty 4

## 2018-04-22 MED ORDER — ORAL CARE MOUTH RINSE
15.0000 mL | Freq: Two times a day (BID) | OROMUCOSAL | Status: DC
  Administered 2018-04-23 – 2018-04-29 (×7): 15 mL via OROMUCOSAL

## 2018-04-22 MED ORDER — CHLORHEXIDINE GLUCONATE 0.12 % MT SOLN
15.0000 mL | Freq: Two times a day (BID) | OROMUCOSAL | Status: DC
  Administered 2018-04-22 – 2018-04-28 (×8): 15 mL via OROMUCOSAL
  Filled 2018-04-22 (×7): qty 15

## 2018-04-22 MED ORDER — BISACODYL 10 MG RE SUPP: 10.0000 mg | Freq: Every day | RECTAL | Status: DC | PRN

## 2018-04-22 MED ORDER — SENNOSIDES-DOCUSATE SODIUM 8.6-50 MG PO TABS: 2.0000 | ORAL_TABLET | Freq: Two times a day (BID) | ORAL | Status: DC | PRN

## 2018-04-22 MED ORDER — FUROSEMIDE 10 MG/ML IJ SOLN
40.0000 mg | Freq: Once | INTRAMUSCULAR | Status: AC
  Administered 2018-04-22: 40 mg via INTRAVENOUS
  Filled 2018-04-22: qty 4

## 2018-04-22 NOTE — Progress Notes (Signed)
Good day. Up in chair x 4 hours. High flow oxygen down to 80%. Great outlook and very positive today. Back to bed without problems. Incontinent of large amount of urine- pure wic not placed correctly.Pure wic repositioned.  Urine clear yellow.Tired. Placed back on BiPAP. Weaned from 100% FIO2 to 65 %. Resting quietly.

## 2018-04-22 NOTE — Progress Notes (Signed)
Over the last 24 hours patient required BiPAP overnight.she was on high flow oxygen most of the day without any difficulty. She states she does get nervous and anxious and may have had a panic attack.  Vitals:   04/22/18 0300 04/22/18 0400 04/22/18 0500 04/22/18 0600  BP: (!) 117/59 (!) 106/57 140/81 103/66  Pulse: 65 62 82 73  Resp: 19 18 (!) 26 18  Temp:      TempSrc:      SpO2: 97% 97% 98% 97%  Weight:      Height:        Gen: WDWN in NAD HEENT: NCAT, sclerae white, oropharynx normal Neck: No LAN, no JVD noted Lungs: full BS, coarse crackles, no wheezes Cardiovascular: Regular, normal rate, no M noted Abdomen: Soft, NT, +BS Ext: no C/C/E Neuro: PERRL, EOMI, motor/sensory grossly intact Skin: No lesions noted  BMP Latest Ref Rng & Units 04/21/2018 04/20/2018 04/19/2018  Glucose 70 - 99 mg/dL 145(H) 157(H) 107(H)  BUN 8 - 23 mg/dL 28(H) 19 17  Creatinine 0.44 - 1.00 mg/dL 1.21(H) 1.41(H) 1.08(H)  Sodium 135 - 145 mmol/L 140 144 142  Potassium 3.5 - 5.1 mmol/L 3.4(L) 3.3(L) 3.4(L)  Chloride 98 - 111 mmol/L 105 109 111  CO2 22 - 32 mmol/L 26 23 25   Calcium 8.9 - 10.3 mg/dL 8.8(L) 8.7(L) 8.4(L)   CBC Latest Ref Rng & Units 04/21/2018 04/20/2018 04/19/2018  WBC 3.6 - 11.0 K/uL 20.4(H) 23.0(H) 20.5(H)  Hemoglobin 12.0 - 16.0 g/dL 12.1 11.4(L) 10.8(L)  Hematocrit 35.0 - 47.0 % 37.0 34.4(L) 32.1(L)  Platelets 150 - 440 K/uL 280 221 170   CXR: no new film  CT chest reviewed by me  IMPRESSION: Severe hypoxemic respiratory failure, Suspected CAP on azithromycin and cefepime.both Legionella and pneumococcal antigens negative. Will complete 5 day course of azithromycin  AKI, nonoliguric. Last BUN/creatinine 28/1.21  Mild hypokalemia. On replacement  DM 2, Steroid exacerbated hyperglycemia, On sliding so coverage  [name], DO  Patient ID: Natasha Bentley, female   DOB: 04-17-1935, 83 y.o.   MRN: [id number] Patient ID: Natasha Bentley, female   DOB: Aug 11, 1935, 83 y.o.    MRN: [id number]

## 2018-04-22 NOTE — Progress Notes (Signed)
[facility] at [place] NAME: Natasha Bentley    MR#:  [id number]  DATE OF BIRTH:  05/19/1935  SUBJECTIVE:  CHIEF COMPLAINT:   Chief Complaint  Patient presents with  . Shortness of Breath   On 100% HFNC Patient seen and evaluated today Has shortness of breath Occasional cough  REVIEW OF SYSTEMS:  CONSTITUTIONAL: Fatigue present EYES: No blurred or double vision.  EARS, NOSE, AND THROAT: No tinnitus or ear pain.  RESPIRATORY: Has shortness of breath and dry cough.. Chronic orthopnea. CARDIOVASCULAR: No chest pain, orthopnea, edema.  GASTROINTESTINAL: No nausea, vomiting, diarrhea or abdominal pain.  GENITOURINARY: No dysuria, hematuria.  ENDOCRINE: No polyuria, nocturia,  HEMATOLOGY: No anemia, easy bruising or bleeding SKIN: No rash or lesion. MUSCULOSKELETAL: No joint pain or arthritis.   NEUROLOGIC: No tingling, numbness, weakness.  PSYCHIATRY: No anxiety or depression.   ROS  DRUG ALLERGIES:   Allergies  Allergen Reactions  . Etodolac Diarrhea  . Sulfa Antibiotics     VITALS:  Blood pressure (!) 131/40, pulse 78, temperature 98.5 F (36.9 C), temperature source Axillary, resp. rate (!) 23, height 5\' 10"  (1.778 m), weight 94.7 kg (208 lb 11.2 oz), SpO2 94 %.  PHYSICAL EXAMINATION:  GENERAL:  83 y.o.-year-old patient lying in the bed.  Looks critically ill on high flow oxygen via nasal cannula EYES: Pupils equal, round, reactive to light and accommodation. No scleral icterus. Extraocular muscles intact.  HEENT: Head atraumatic, normocephalic. Oropharynx and nasopharynx clear.  NECK:  Supple, no jugular venous distention. No thyroid enlargement, no tenderness.  LUNGS: Bilateral coarse breath sounds and wheezing CARDIOVASCULAR: S1, S2 normal. No murmurs, rubs, or gallops.  ABDOMEN: Soft, nontender, nondistended. Bowel sounds present. No organomegaly or mass.  EXTREMITIES: No pedal edema, cyanosis, or clubbing.  NEUROLOGIC:  Moves all 4 extremities equally PSYCHIATRIC: The patient is alert and oriented x 3.  SKIN: No obvious rash, lesion, or ulcer.   Physical Exam LABORATORY PANEL:   CBC Recent Labs  Lab 04/21/18 0413  WBC 20.4*  HGB 12.1  HCT 37.0  PLT 280   ------------------------------------------------------------------------------------------------------------------  Chemistries  Recent Labs  Lab 04/09/2018 1740  04/21/18 0413  NA 138   < > 140  K 3.5   < > 3.4*  CL 105   < > 105  CO2 25   < > 26  GLUCOSE 135*   < > 145*  BUN 21   < > 28*  CREATININE 1.35*   < > 1.21*  CALCIUM 8.7*   < > 8.8*  MG  --    < > 2.1  AST 15  --   --   ALT 14  --   --   ALKPHOS 84  --   --   BILITOT 1.0  --   --    < > = values in this interval not displayed.   ------------------------------------------------------------------------------------------------------------------  Cardiac Enzymes Recent Labs  Lab 04/24/2018 1742  TROPONINI <0.03   ------------------------------------------------------------------------------------------------------------------  RADIOLOGY:  Dg Chest Port 1 View  Result Date: 04/21/2018 CLINICAL DATA:  Respiratory failure EXAM: PORTABLE CHEST 1 VIEW COMPARISON:  04/12/2018 FINDINGS: Shallow inspiration. Increasing diffuse bilateral airspace and interstitial disease. This may represent edema, multifocal pneumonia, or ARDS. No definite effusions. No pneumothorax. Heart size is obscured by parenchymal process. Calcification of the aorta. IMPRESSION: Increasing bilateral airspace and interstitial disease in the lungs. Electronically Signed   By: Lucienne Capers M.D.   On: 04/21/2018 01:28  ASSESSMENT AND PLAN:     83 year old female patient with history of diabetes mellitus, hypertension, hyperlipidemia presented to the emergency room for cough, fever and shortness of breath  *Bilateral pneumonia.  Community-acquired. Currently on cefepime and azithromycin Cultures no  growth to date.  -Acute respiratory failure with persistent hypoxia On 100% HFNC Nebulization treatments Patient is a partial code  -Hypertension Medications on hold  -Type 2 diabetes mellitus Diabetic diet with sliding scale coverage with insulin  -DVT prophylaxis  Lovenox [place] daily  All the records are reviewed and case discussed with Care Management/Social Workerr. Management plans discussed with the patient, family and they are in agreement.  CODE STATUS: DNR  TOTAL TIME TAKING CARE OF THIS PATIENT: 36 minutes.   POSSIBLE D/C IN 1-2 DAYS, DEPENDING ON CLINICAL CONDITION.  Saundra Shelling M.D on 04/22/2018   Between 7am to 6pm - Pager - [phone]  After 6pm go to [email or web address] - password [email or web address] [facility]  Office  [phone]  CC: Primary care physician; [name], MD  Note: This dictation was prepared with Dragon dictation along with smaller phrase technology. Any transcriptional errors that result from this process are unintentional.

## 2018-04-23 LAB — BASIC METABOLIC PANEL
ANION GAP: 12 (ref 5–15)
ANION GAP: 9 (ref 5–15)
BUN: 52 mg/dL — ABNORMAL HIGH (ref 8–23)
BUN: 53 mg/dL — AB (ref 8–23)
CALCIUM: 8.9 mg/dL (ref 8.9–10.3)
CALCIUM: 8.3 mg/dL — AB (ref 8.9–10.3)
CO2: 27 mmol/L (ref 22–32)
CO2: 28 mmol/L (ref 22–32)
Chloride: 104 mmol/L (ref 98–111)
Chloride: 104 mmol/L (ref 98–111)
Creatinine, Ser: 1.51 mg/dL — ABNORMAL HIGH (ref 0.44–1.00)
Creatinine, Ser: 1.3 mg/dL — ABNORMAL HIGH (ref 0.44–1.00)
GFR calc Af Amer: 43 mL/min — ABNORMAL LOW (ref >60)
GFR, EST AFRICAN AMERICAN: 36 mL/min — AB (ref >60)
GFR, EST NON AFRICAN AMERICAN: 31 mL/min — AB (ref >60)
GFR, EST NON AFRICAN AMERICAN: 37 mL/min — AB (ref >60)
GLUCOSE: 145 mg/dL — AB (ref 70–99)
GLUCOSE: 154 mg/dL — AB (ref 70–99)
POTASSIUM: 3.4 mmol/L — AB (ref 3.5–5.1)
POTASSIUM: 3.3 mmol/L — AB (ref 3.5–5.1)
SODIUM: 141 mmol/L (ref 135–145)
Sodium: 143 mmol/L (ref 135–145)

## 2018-04-23 LAB — CBC
HEMATOCRIT: 38.2 % (ref 35.0–47.0)
Hemoglobin: 12.8 g/dL (ref 12.0–16.0)
MCH: 28.8 pg (ref 26.0–34.0)
MCHC: 33.4 g/dL (ref 32.0–36.0)
MCV: 86.4 fL (ref 80.0–100.0)
Platelets: 298 10*3/uL (ref 150–440)
RBC: 4.42 MIL/uL (ref 3.80–5.20)
RDW: 15.5 % — ABNORMAL HIGH (ref 11.5–14.5)
WBC: 18 10*3/uL — AB (ref 3.6–11.0)

## 2018-04-23 LAB — GLUCOSE, CAPILLARY
GLUCOSE-CAPILLARY: 142 mg/dL — AB (ref 70–99)
GLUCOSE-CAPILLARY: 112 mg/dL — AB (ref 70–99)
GLUCOSE-CAPILLARY: 147 mg/dL — AB (ref 70–99)
Glucose-Capillary: 127 mg/dL — ABNORMAL HIGH (ref 70–99)

## 2018-04-23 LAB — CULTURE, BLOOD (ROUTINE X 2)
CULTURE: NO GROWTH
Culture: NO GROWTH
SPECIAL REQUESTS: ADEQUATE
Special Requests: ADEQUATE

## 2018-04-23 MED ORDER — SODIUM CHLORIDE 0.9 % IV SOLN
2.0000 g | INTRAVENOUS | Status: DC
  Administered 2018-04-24: 2 g via INTRAVENOUS
  Filled 2018-04-23 (×2): qty 2

## 2018-04-23 MED ORDER — SODIUM CHLORIDE 0.9 % IV SOLN
1.0000 g | Freq: Two times a day (BID) | INTRAVENOUS | Status: DC
  Filled 2018-04-23: qty 1

## 2018-04-23 NOTE — Progress Notes (Signed)
[facility] at [place] NAME: Natasha Bentley    MR#:  [id number]  DATE OF BIRTH:  03-Nov-1934  SUBJECTIVE:  CHIEF COMPLAINT:   Chief Complaint  Patient presents with  . Shortness of Breath   On 100% HFNC Patient seen and evaluated today Has improved shortness of breath Occasional cough  REVIEW OF SYSTEMS:  CONSTITUTIONAL: Fatigue present EYES: No blurred or double vision.  EARS, NOSE, AND THROAT: No tinnitus or ear pain.  RESPIRATORY: Has decreased shortness of breath and dry cough.. Chronic orthopnea. CARDIOVASCULAR: No chest pain, orthopnea, edema.  GASTROINTESTINAL: No nausea, vomiting, diarrhea or abdominal pain.  GENITOURINARY: No dysuria, hematuria.  ENDOCRINE: No polyuria, nocturia,  HEMATOLOGY: No anemia, easy bruising or bleeding SKIN: No rash or lesion. MUSCULOSKELETAL: No joint pain or arthritis.   NEUROLOGIC: No tingling, numbness, weakness.  PSYCHIATRY: No anxiety or depression.   ROS  DRUG ALLERGIES:   Allergies  Allergen Reactions  . Etodolac Diarrhea  . Sulfa Antibiotics     VITALS:  Blood pressure 121/84, pulse 78, temperature 98.2 F (36.8 C), temperature source Axillary, resp. rate 20, height 5\' 10"  (1.778 m), weight 94.7 kg (208 lb 11.2 oz), SpO2 95 %.  PHYSICAL EXAMINATION:  GENERAL:  83 y.o.-year-old patient lying in the bed.  Looks critically ill on high flow oxygen via nasal cannula EYES: Pupils equal, round, reactive to light and accommodation. No scleral icterus. Extraocular muscles intact.  HEENT: Head atraumatic, normocephalic. Oropharynx and nasopharynx clear.  NECK:  Supple, no jugular venous distention. No thyroid enlargement, no tenderness.  LUNGS: Bilateral coarse breath sounds and wheezing CARDIOVASCULAR: S1, S2 normal. No murmurs, rubs, or gallops.  ABDOMEN: Soft, nontender, nondistended. Bowel sounds present. No organomegaly or mass.  EXTREMITIES: No pedal edema, cyanosis, or clubbing.   NEUROLOGIC: Moves all 4 extremities equally PSYCHIATRIC: The patient is alert and oriented x 3.  SKIN: No obvious rash, lesion, or ulcer.   Physical Exam LABORATORY PANEL:   CBC Recent Labs  Lab 04/23/18 0501  WBC 18.0*  HGB 12.8  HCT 38.2  PLT 298   ------------------------------------------------------------------------------------------------------------------  Chemistries  Recent Labs  Lab 03/31/2018 1740  04/21/18 0413 04/23/18 0501  NA 138   < > 140 143  K 3.5   < > 3.4* 3.4*  CL 105   < > 105 104  CO2 25   < > 26 27  GLUCOSE 135*   < > 145* 145*  BUN 21   < > 28* 52*  CREATININE 1.35*   < > 1.21* 1.51*  CALCIUM 8.7*   < > 8.8* 8.9  MG  --    < > 2.1  --   AST 15  --   --   --   ALT 14  --   --   --   ALKPHOS 84  --   --   --   BILITOT 1.0  --   --   --    < > = values in this interval not displayed.   ------------------------------------------------------------------------------------------------------------------  Cardiac Enzymes Recent Labs  Lab 03/28/2018 1742  TROPONINI <0.03   ------------------------------------------------------------------------------------------------------------------  RADIOLOGY:  Dg Chest Port 1 View  Result Date: 04/22/2018 CLINICAL DATA:  Pneumonia EXAM: PORTABLE CHEST 1 VIEW COMPARISON:  04/21/2018 FINDINGS: Decreased bilateral airspace opacities/edema noted. Cardiomediastinal silhouette is unchanged. Low volume film with mild bibasilar opacities/atelectasis noted. No pneumothorax or acute bony abnormality. IMPRESSION: Decreased bilateral airspace opacities/edema. No other significant change. Electronically Signed  By: Margarette Canada M.D.   On: 04/22/2018 13:28   [place] Ekg Site Rite  Result Date: 04/22/2018 If Site Rite image not attached, placement could not be confirmed due to current cardiac rhythm.   ASSESSMENT AND PLAN:     83 year old female patient with history of diabetes mellitus, hypertension, hyperlipidemia  presented to the emergency room for cough, fever and shortness of breath  *Bilateral pneumonia.  Community-acquired. Currently on cefepime and azithromycin Cultures no growth to date.   Legionella and pneumococcal antigens are negative   -Acute respiratory failure with persistent hypoxia On 100% HFNC Nebulization treatments Patient is a partial code  -Hypertension Medications on hold  -Type 2 diabetes mellitus Diabetic diet with sliding scale coverage with insulin  -DVT prophylaxis  Lovenox [place] daily  All the records are reviewed and case discussed with Care Management/Social Workerr. Management plans discussed with the patient, family and they are in agreement.  CODE STATUS: DNR  TOTAL TIME TAKING CARE OF THIS PATIENT: 34 minutes.   POSSIBLE D/C IN 1-2 DAYS, DEPENDING ON CLINICAL CONDITION.  Saundra Shelling M.D on 04/23/2018   Between 7am to 6pm - Pager - [phone]  After 6pm go to [email or web address] - password [email or web address] [facility]  Office  [phone]  CC: Primary care physician; [name], MD  Note: This dictation was prepared with Dragon dictation along with smaller phrase technology. Any transcriptional errors that result from this process are unintentional.

## 2018-04-23 NOTE — Progress Notes (Signed)
Spoke with patient's nurse concerning need for an ordered triple lumen PICC. Antibiotics ordered are compatible. At the time of this writing, the patient has two working IVs. Patient's nurse was concerned for the need for more access for possible transfusion or multiple drips. This author paged the ordering provider twice, Dr. Jefferson Fuel, without a return call. Will follow up in the am concerning continued need for PICC.

## 2018-04-23 NOTE — Progress Notes (Signed)
Triple lumen PICC ordered, as patient has one PIV. Assessed arm and was able to place two additional PIVs. Patient not on continuous sedation, anticoagulants, vasoactive drips, or parenteral nutrition ordered. Informed nurse of additional accesses. PICC order discontinued. Primary care to reorder PICC if need arises.

## 2018-04-23 NOTE — Progress Notes (Signed)
Over the last 24 hours patient has done well, states shortness of breath has significantly improved Vitals:   04/23/18 0400 04/23/18 0500 04/23/18 0600 04/23/18 0815  BP: (!) 167/73     Pulse: 87 80 75   Resp: (!) 26 (!) 32 (!) 21   Temp:      TempSrc:      SpO2: (!) 88% 93% 96% 100%  Weight:      Height:        Gen: WDWN in NAD HEENT: NCAT, sclerae white, oropharynx normal Neck: No LAN, no JVD noted Lungs: full BS, reduced crackles, no wheezes Cardiovascular: Regular, normal rate, no M noted Abdomen: Soft, NT, +BS Ext: no C/C/E Neuro: PERRL, EOMI, motor/sensory grossly intact Skin: No lesions noted  BMP Latest Ref Rng & Units 04/23/2018 04/21/2018 04/20/2018  Glucose 70 - 99 mg/dL 145(H) 145(H) 157(H)  BUN 8 - 23 mg/dL 52(H) 28(H) 19  Creatinine 0.44 - 1.00 mg/dL 1.51(H) 1.21(H) 1.41(H)  Sodium 135 - 145 mmol/L 143 140 144  Potassium 3.5 - 5.1 mmol/L 3.4(L) 3.4(L) 3.3(L)  Chloride 98 - 111 mmol/L 104 105 109  CO2 22 - 32 mmol/L 27 26 23   Calcium 8.9 - 10.3 mg/dL 8.9 8.8(L) 8.7(L)   CBC Latest Ref Rng & Units 04/23/2018 04/21/2018 04/20/2018  WBC 3.6 - 11.0 K/uL 18.0(H) 20.4(H) 23.0(H)  Hemoglobin 12.0 - 16.0 g/dL 12.8 12.1 11.4(L)  Hematocrit 35.0 - 47.0 % 38.2 37.0 34.4(L)  Platelets 150 - 440 K/uL 298 280 221   CXR: no new film  CT chest reviewed by me  IMPRESSION: Severe hypoxemic respiratory failure, Suspected CAP on azithromycin and cefepime.both Legionella and pneumococcal antigens negative. Will complete 5 day course of azithromycin, also component of pulmonary edema. Patient has diuresed well -1.260 L yesterday. We'll hold on additional diuresis as BUN/creatinine increasing  AKI, nonoliguric. Last BUN/creatinine 2/1.51  Leukocytosis. White count is 18, on azithromycin and cefepime  Mild hypokalemia. On replacement  DM 2, Steroid exacerbated hyperglycemia, On sliding so coverage  [name], DO  Patient ID: Natasha Bentley, female   DOB: Jul 20, 1935, 83  y.o.   MRN: [id number] Patient ID: Natasha Bentley, female   DOB: 06-Sep-1935, 83 y.o.   MRN: [id number] Patient ID: Natasha Bentley, female   DOB: 06/26/1935, 83 y.o.   MRN: [id number]

## 2018-04-23 NOTE — Plan of Care (Signed)
Patient remained on BiPap this shift.  No acute distress noted.  Makes needs known.  Asleep most of shift.  Will continue to monitor.

## 2018-04-23 NOTE — Progress Notes (Signed)
Pharmacy Antibiotic Note  Natasha Bentley is a 83 y.o. female admitted on 04/01/2018 with suspected CAP and severe hypoxemic respiratory failure.  Pharmacy has been consulted for cefepime dosing.  Plan: Will change to Cefepime from 2 g IV q12 hours  To 2 g IV q24 hours due to > 0.3 mg/dl of Serum creatinine.   Height: 5\' 10"  (177.8 cm) Weight: 208 lb 11.2 oz (94.7 kg) IBW/kg (Calculated) : 68.5  Temp (24hrs), Avg:98.3 F (36.8 C), Min:97.8 F (36.6 C), Max:98.8 F (37.1 C)  Recent Labs  Lab 04/20/2018 1740 04/17/2018 2047 04/19/18 0425 04/20/18 0450 04/21/18 0413 04/23/18 0501  WBC 21.9*  --  20.5* 23.0* 20.4* 18.0*  CREATININE 1.35*  --  1.08* 1.41* 1.21* 1.51*  LATICACIDVEN 2.1* 1.1  --   --   --   --     Estimated Creatinine Clearance: 36.4 mL/min (A) (by C-G formula based on SCr of 1.51 mg/dL (H)).    Allergies  Allergen Reactions  . Etodolac Diarrhea  . Sulfa Antibiotics     Antimicrobials this admission: Azithromycin 7/23, 7/25 >> 7/29 Ceftriaxone 7/24 Vancomycin 7/24 >> 7/25 Cefepime 7/24 >>    Dose adjustments this admission: N/A  Microbiology results: 7/23 BCx: no growth x 3 days  7/24 MRSA PCR: negative   Thank you for allowing pharmacy to be a part of this patient's care.  [name] D 04/23/2018 1:56 PM

## 2018-04-24 LAB — GLUCOSE, CAPILLARY
GLUCOSE-CAPILLARY: 111 mg/dL — AB (ref 70–99)
Glucose-Capillary: 116 mg/dL — ABNORMAL HIGH (ref 70–99)
Glucose-Capillary: 200 mg/dL — ABNORMAL HIGH (ref 70–99)
Glucose-Capillary: 148 mg/dL — ABNORMAL HIGH (ref 70–99)

## 2018-04-24 LAB — BASIC METABOLIC PANEL
Anion gap: 10 (ref 5–15)
BUN: 53 mg/dL — ABNORMAL HIGH (ref 8–23)
CALCIUM: 8.4 mg/dL — AB (ref 8.9–10.3)
CHLORIDE: 104 mmol/L (ref 98–111)
CO2: 28 mmol/L (ref 22–32)
CREATININE: 1.12 mg/dL — AB (ref 0.44–1.00)
GFR calc non Af Amer: 45 mL/min — ABNORMAL LOW (ref >60)
GFR, EST AFRICAN AMERICAN: 52 mL/min — AB (ref >60)
Glucose, Bld: 134 mg/dL — ABNORMAL HIGH (ref 70–99)
Potassium: 3.3 mmol/L — ABNORMAL LOW (ref 3.5–5.1)
Sodium: 142 mmol/L (ref 135–145)

## 2018-04-24 LAB — MAGNESIUM: Magnesium: 2.5 mg/dL — ABNORMAL HIGH (ref 1.7–2.4)

## 2018-04-24 LAB — CBC
HEMATOCRIT: 38.3 % (ref 35.0–47.0)
Hemoglobin: 12.8 g/dL (ref 12.0–16.0)
MCH: 28.6 pg (ref 26.0–34.0)
MCHC: 33.4 g/dL (ref 32.0–36.0)
MCV: 85.7 fL (ref 80.0–100.0)
Platelets: 299 10*3/uL (ref 150–440)
RBC: 4.47 MIL/uL (ref 3.80–5.20)
RDW: 15.3 % — AB (ref 11.5–14.5)
WBC: 19.9 10*3/uL — ABNORMAL HIGH (ref 3.6–11.0)

## 2018-04-24 LAB — PHOSPHORUS: Phosphorus: 2.9 mg/dL (ref 2.5–4.6)

## 2018-04-24 MED ORDER — SODIUM CHLORIDE 0.9 % IV SOLN
2.0000 g | INTRAVENOUS | Status: AC
  Administered 2018-04-24 – 2018-04-25 (×2): 2 g via INTRAVENOUS
  Filled 2018-04-24 (×2): qty 2

## 2018-04-24 MED ORDER — POTASSIUM CHLORIDE 20 MEQ PO PACK
40.0000 meq | PACK | Freq: Once | ORAL | Status: AC
  Administered 2018-04-24: 40 meq via ORAL
  Filled 2018-04-24: qty 2

## 2018-04-24 MED ORDER — CEFEPIME HCL 2 G IJ SOLR
2.0000 g | INTRAMUSCULAR | Status: DC
  Filled 2018-04-24: qty 2

## 2018-04-24 MED ORDER — IRBESARTAN 150 MG PO TABS
150.0000 mg | ORAL_TABLET | Freq: Every day | ORAL | Status: DC
  Administered 2018-04-24 – 2018-04-28 (×5): 150 mg via ORAL
  Filled 2018-04-24 (×3): qty 1

## 2018-04-24 NOTE — Progress Notes (Signed)
[place] [facility] at [place] NAME: Natasha Bentley    MR#:  [id number]  DATE OF BIRTH:  Jul 16, 1935  SUBJECTIVE:  CHIEF COMPLAINT:   Chief Complaint  Patient presents with  . Shortness of Breath   On 100% HFNC Last night needed bipap for desaturation Patient seen and evaluated today Occasional cough  REVIEW OF SYSTEMS:  CONSTITUTIONAL: Fatigue present EYES: No blurred or double vision.  EARS, NOSE, AND THROAT: No tinnitus or ear pain.  RESPIRATORY: Has  shortness of breath and dry cough.. Chronic orthopnea. CARDIOVASCULAR: No chest pain, orthopnea, edema.  GASTROINTESTINAL: No nausea, vomiting, diarrhea or abdominal pain.  GENITOURINARY: No dysuria, hematuria.  ENDOCRINE: No polyuria, nocturia,  HEMATOLOGY: No anemia, easy bruising or bleeding SKIN: No rash or lesion. MUSCULOSKELETAL: No joint pain or arthritis.   NEUROLOGIC: No tingling, numbness, weakness.  PSYCHIATRY: No anxiety or depression.   ROS  DRUG ALLERGIES:   Allergies  Allergen Reactions  . Etodolac Diarrhea  . Sulfa Antibiotics     VITALS:  Blood pressure (!) 160/71, pulse 64, temperature 98.2 F (36.8 C), temperature source Oral, resp. rate (!) 21, height 5\' 10"  (1.778 m), weight 94.7 kg (208 lb 11.2 oz), SpO2 93 %.  PHYSICAL EXAMINATION:  GENERAL:  83 y.o.-year-old patient lying in the bed.  Looks critically ill on high flow oxygen via nasal cannula EYES: Pupils equal, round, reactive to light and accommodation. No scleral icterus. Extraocular muscles intact.  HEENT: Head atraumatic, normocephalic. Oropharynx and nasopharynx clear.  NECK:  Supple, no jugular venous distention. No thyroid enlargement, no tenderness.  LUNGS: Bilateral coarse breath sounds and wheezing CARDIOVASCULAR: S1, S2 normal. No murmurs, rubs, or gallops.  ABDOMEN: Soft, nontender, nondistended. Bowel sounds present. No organomegaly or mass.  EXTREMITIES: No pedal edema, cyanosis, or clubbing.   NEUROLOGIC: Moves all 4 extremities equally PSYCHIATRIC: The patient is alert and oriented x 3.  SKIN: No obvious rash, lesion, or ulcer.   Physical Exam LABORATORY PANEL:   CBC Recent Labs  Lab 04/24/18 0433  WBC 19.9*  HGB 12.8  HCT 38.3  PLT 299   ------------------------------------------------------------------------------------------------------------------  Chemistries  Recent Labs  Lab 04/20/2018 1740  04/24/18 0433  NA 138   < > 142  K 3.5   < > 3.3*  CL 105   < > 104  CO2 25   < > 28  GLUCOSE 135*   < > 134*  BUN 21   < > 53*  CREATININE 1.35*   < > 1.12*  CALCIUM 8.7*   < > 8.4*  MG  --    < > 2.5*  AST 15  --   --   ALT 14  --   --   ALKPHOS 84  --   --   BILITOT 1.0  --   --    < > = values in this interval not displayed.   ------------------------------------------------------------------------------------------------------------------  Cardiac Enzymes Recent Labs  Lab 04/14/2018 1742  TROPONINI <0.03   ------------------------------------------------------------------------------------------------------------------  RADIOLOGY:  [place] Ekg Site Rite  Result Date: 04/22/2018 If Site Rite image not attached, placement could not be confirmed due to current cardiac rhythm.   ASSESSMENT AND PLAN:     83 year old female patient with history of diabetes mellitus, hypertension, hyperlipidemia presented to the emergency room for cough, fever and shortness of breath  -Bilateral pneumonia.  Community-acquired. Currently on cefepime and azithromycin Cultures no growth to date.   Legionella and pneumococcal antigens are negative   -  Acute respiratory failure with persistent hypoxia with slow improvement On 100% HFNC Nebulization treatments Patient is a partial code  -Hypertension Medications on hold  -Type 2 diabetes mellitus Diabetic diet with sliding scale coverage with insulin  -DVT prophylaxis  Lovenox [place] daily  All the records are  reviewed and case discussed with Care Management/Social Workerr. Management plans discussed with the patient, family and they are in agreement.  CODE STATUS: DNR  TOTAL TIME TAKING CARE OF THIS PATIENT: 34 minutes.   POSSIBLE D/C IN 1-2 DAYS, DEPENDING ON CLINICAL CONDITION.  Saundra Shelling M.D on 04/24/2018   Between 7am to 6pm - Pager - [phone]  After 6pm go to [email or web address] - password [email or web address] [facility]  Office  [phone]  CC: Primary care physician; [name], MD  Note: This dictation was prepared with Dragon dictation along with smaller phrase technology. Any transcriptional errors that result from this process are unintentional.

## 2018-04-24 NOTE — Progress Notes (Signed)
Pharmacy Electrolyte Monitoring Consult:  Pharmacy consulted to assist in monitoring and replacing electrolytes in this 83 y.o. female admitted on 04/05/2018 with Shortness of Breath  Labs:  Sodium (mmol/L)  Date Value  04/24/2018 142  01/26/2013 140   Potassium (mmol/L)  Date Value  04/24/2018 3.3 (L)  01/26/2013 3.6   Magnesium (mg/dL)  Date Value  04/24/2018 2.5 (H)   Phosphorus (mg/dL)  Date Value  04/24/2018 2.9   Calcium (mg/dL)  Date Value  04/24/2018 8.4 (L)   Calcium, Total (mg/dL)  Date Value  01/26/2013 7.9 (L)   Albumin (g/dL)  Date Value  04/17/2018 3.3 (L)    Assessment/Plan: Potassium 35mEq PO x 1. Will recheck BMP with am labs. Goal potassium ~4; goal magnesium ~ 2.   Pharmacy will continue to monitor and adjust per consult.   Strider Vallance L 04/24/2018 5:48 PM

## 2018-04-24 NOTE — Progress Notes (Signed)
Subjective: Over last 24 hours Required going on BiPAP overnight secondary to desaturation  Vitals:   04/24/18 0300 04/24/18 0400 04/24/18 0500 04/24/18 0600  BP: (!) 158/78 (!) 148/69 (!) 153/134 (!) 160/71  Pulse: 71 64 72 64  Resp: 17 20 (!) 22 (!) 21  Temp:      TempSrc:      SpO2: 90% 90% 90% 93%  Weight:      Height:        Gen: WDWN in NAD HEENT: NCAT, sclerae white, oropharynx normal Neck: No LAN, no JVD noted Lungs: full BS, reduced crackles, no wheezes Cardiovascular: Regular, normal rate, no M noted Abdomen: Soft, NT, +BS Ext: no C/C/E Neuro: PERRL, EOMI, motor/sensory grossly intact Skin: No lesions noted  BMP Latest Ref Rng & Units 04/24/2018 04/23/2018 04/23/2018  Glucose 70 - 99 mg/dL 134(H) 154(H) 145(H)  BUN 8 - 23 mg/dL 53(H) 53(H) 52(H)  Creatinine 0.44 - 1.00 mg/dL 1.12(H) 1.30(H) 1.51(H)  Sodium 135 - 145 mmol/L 142 141 143  Potassium 3.5 - 5.1 mmol/L 3.3(L) 3.3(L) 3.4(L)  Chloride 98 - 111 mmol/L 104 104 104  CO2 22 - 32 mmol/L 28 28 27   Calcium 8.9 - 10.3 mg/dL 8.4(L) 8.3(L) 8.9   CBC Latest Ref Rng & Units 04/24/2018 04/23/2018 04/21/2018  WBC 3.6 - 11.0 K/uL 19.9(H) 18.0(H) 20.4(H)  Hemoglobin 12.0 - 16.0 g/dL 12.8 12.8 12.1  Hematocrit 35.0 - 47.0 % 38.3 38.2 37.0  Platelets 150 - 440 K/uL 299 298 280   CXR: no new film  CT chest reviewed by me  IMPRESSION: Severe hypoxemic respiratory failure, Suspected CAP on azithromycin and cefepime.both Legionella and pneumococcal antigens negative. Will complete 5 day course of azithromycin, also component of pulmonary edema. We'll hold on additional diuresis as BUN/creatinine increasing  AKI, nonoliguric. Last BUN/creatinine 53/1.12  Leukocytosis. White count is 19.9, on azithromycin and cefepime  Mild hypokalemia. On replacement  DM 2, Steroid exacerbated hyperglycemia, On sliding so coverage  [name], DO  Patient ID: Natasha Bentley, female   DOB: [birth year], 83 y.o.   MRN:  [id number] Patient ID: CITLALY CAMPLIN, female   DOB: 11/16/1934, 83 y.o.   MRN: [id number] Patient ID: JAMEELAH WATTS, female   DOB: 29-May-1935, 83 y.o.   MRN: [id number] Patient ID: CALIANA SPIRES, female   DOB: 11-04-1934, 83 y.o.   MRN: [id number]

## 2018-04-24 NOTE — Progress Notes (Signed)
Pharmacy Antibiotic Note  Natasha Bentley is a 83 y.o. female admitted on 04/10/2018 with suspected CAP and severe hypoxemic respiratory failure.  Pharmacy has been consulted for cefepime dosing.  Plan: Continue Cefepime 2g IV q24hr. Per ICU rounds will treat for 7 total days of therapy. Will continue to monitor serum creatinine. Change in serum creatinine thought to be due to diuresis.   Per ICU rounds 7/26 will continue azithrimycin 500mg  IV Q24hr x 5 doses.   Height: 5\' 10"  (177.8 cm) Weight: 208 lb 11.2 oz (94.7 kg) IBW/kg (Calculated) : 68.5  Temp (24hrs), Avg:98.2 F (36.8 C), Min:97.6 F (36.4 C), Max:98.6 F (37 C)  Recent Labs  Lab 04/12/2018 1740 04/05/2018 2047 04/19/18 0425 04/20/18 0450 04/21/18 0413 04/23/18 0501 04/23/18 2324 04/24/18 0433  WBC 21.9*  --  20.5* 23.0* 20.4* 18.0*  --  19.9*  CREATININE 1.35*  --  1.08* 1.41* 1.21* 1.51* 1.30* 1.12*  LATICACIDVEN 2.1* 1.1  --   --   --   --   --   --     Estimated Creatinine Clearance: 49.1 mL/min (A) (by C-G formula based on SCr of 1.12 mg/dL (H)).    Allergies  Allergen Reactions  . Etodolac Diarrhea  . Sulfa Antibiotics     Antimicrobials this admission: Azithromycin 7/23, 7/25 >> 7/29 Ceftriaxone 7/24 Vancomycin 7/24 >> 7/25 Cefepime 7/24 >>  7/30  Dose adjustments this admission: 7/28 Cefepime changed to q24hr,.   Microbiology results: 7/23 BCx: no growth x 5 days  7/24 MRSA PCR: negative   Thank you for allowing pharmacy to be a part of this patient's care.  Ravinder Hofland L 04/24/2018 5:51 PM

## 2018-04-25 LAB — BASIC METABOLIC PANEL
Anion gap: 8 (ref 5–15)
BUN: 51 mg/dL — AB (ref 8–23)
CHLORIDE: 108 mmol/L (ref 98–111)
CO2: 26 mmol/L (ref 22–32)
CREATININE: 1.06 mg/dL — AB (ref 0.44–1.00)
Calcium: 8.5 mg/dL — ABNORMAL LOW (ref 8.9–10.3)
GFR calc Af Amer: 56 mL/min — ABNORMAL LOW (ref >60)
GFR calc non Af Amer: 48 mL/min — ABNORMAL LOW (ref >60)
GLUCOSE: 141 mg/dL — AB (ref 70–99)
POTASSIUM: 4 mmol/L (ref 3.5–5.1)
SODIUM: 142 mmol/L (ref 135–145)

## 2018-04-25 LAB — GLUCOSE, CAPILLARY
GLUCOSE-CAPILLARY: 127 mg/dL — AB (ref 70–99)
GLUCOSE-CAPILLARY: 106 mg/dL — AB (ref 70–99)
Glucose-Capillary: 118 mg/dL — ABNORMAL HIGH (ref 70–99)
Glucose-Capillary: 88 mg/dL (ref 70–99)

## 2018-04-25 MED ORDER — PHENOL 1.4 % MT LIQD
1.0000 | OROMUCOSAL | Status: DC | PRN
  Administered 2018-04-25: 1 via OROMUCOSAL
  Filled 2018-04-25: qty 177

## 2018-04-25 NOTE — Progress Notes (Signed)
[place] [facility] [place] NAME: Natasha Bentley    MR#:  [id number]  DATE OF BIRTH:  [birth year]  SUBJECTIVE:  CHIEF COMPLAINT:   Chief Complaint  Patient presents with  . Shortness of Breath   On 85% HFNC OFF biPAP Patient seen and evaluated today Decreased cough  REVIEW OF SYSTEMS:  CONSTITUTIONAL: Fatigue present EYES: No blurred or double vision.  EARS, NOSE, AND THROAT: No tinnitus or ear pain.  RESPIRATORY: Has  shortness of breath and dry cough.. Chronic orthopnea. CARDIOVASCULAR: No chest pain, orthopnea, edema.  GASTROINTESTINAL: No nausea, vomiting, diarrhea or abdominal pain.  GENITOURINARY: No dysuria, hematuria.  ENDOCRINE: No polyuria, nocturia,  HEMATOLOGY: No anemia, easy bruising or bleeding SKIN: No rash or lesion. MUSCULOSKELETAL: No joint pain or arthritis.   NEUROLOGIC: No tingling, numbness, weakness.  PSYCHIATRY: No anxiety or depression.   ROS  DRUG ALLERGIES:   Allergies  Allergen Reactions  . Etodolac Diarrhea  . Sulfa Antibiotics     VITALS:  Blood pressure (!) 167/79, pulse 75, temperature 98.4 F (36.9 C), temperature source Oral, resp. rate (!) 22, height 5\' 10"  (1.778 m), weight 94.7 kg (208 lb 11.2 oz), SpO2 90 %.  PHYSICAL EXAMINATION:  GENERAL:  83 y.o.-year-old patient lying in the bed.  Looks critically ill on high flow oxygen via nasal cannula EYES: Pupils equal, round, reactive to light and accommodation. No scleral icterus. Extraocular muscles intact.  HEENT: Head atraumatic, normocephalic. Oropharynx and nasopharynx clear.  NECK:  Supple, no jugular venous distention. No thyroid enlargement, no tenderness.  LUNGS: Bilateral coarse breath sounds and wheezing CARDIOVASCULAR: S1, S2 normal. No murmurs, rubs, or gallops.  ABDOMEN: Soft, nontender, nondistended. Bowel sounds present. No organomegaly or mass.  EXTREMITIES: No pedal edema, cyanosis, or clubbing.  NEUROLOGIC: Moves all 4  extremities equally PSYCHIATRIC: The patient is alert and oriented x 3.  SKIN: No obvious rash, lesion, or ulcer.   Physical Exam LABORATORY PANEL:   CBC Recent Labs  Lab 04/24/18 0433  WBC 19.9*  HGB 12.8  HCT 38.3  PLT 299   ------------------------------------------------------------------------------------------------------------------  Chemistries  Recent Labs  Lab 03/28/2018 1740  04/24/18 0433 04/25/18 0420  NA 138   < > 142 142  K 3.5   < > 3.3* 4.0  CL 105   < > 104 108  CO2 25   < > 28 26  GLUCOSE 135*   < > 134* 141*  BUN 21   < > 53* 51*  CREATININE 1.35*   < > 1.12* 1.06*  CALCIUM 8.7*   < > 8.4* 8.5*  MG  --    < > 2.5*  --   AST 15  --   --   --   ALT 14  --   --   --   ALKPHOS 84  --   --   --   BILITOT 1.0  --   --   --    < > = values in this interval not displayed.   ------------------------------------------------------------------------------------------------------------------  Cardiac Enzymes Recent Labs  Lab 03/31/2018 1742  TROPONINI <0.03   ------------------------------------------------------------------------------------------------------------------  RADIOLOGY:  No results found.  ASSESSMENT AND PLAN:     83 year old female patient with history of diabetes mellitus, hypertension, hyperlipidemia presented to the emergency room for cough, fever and shortness of breath  -Bilateral pneumonia.  Community-acquired improving. Currently on cefepime and azithromycin Cultures no growth to date.   Legionella and pneumococcal antigens are negative   -  Acute respiratory failure with persistent hypoxia with slow improvement On 85% HFNC Wean oxygen as tolerated Nebulization treatments Patient is a partial code  -Hypertension Medications on hold  -Type 2 diabetes mellitus Diabetic diet with sliding scale coverage with insulin  -DVT prophylaxis  Lovenox [place] daily  All the records are reviewed and case discussed with Care  Management/Social Workerr. Management plans discussed with the patient, family and they are in agreement.  CODE STATUS: DNR  TOTAL TIME TAKING CARE OF THIS PATIENT: 33 minutes.   POSSIBLE D/C IN 1-2 DAYS, DEPENDING ON CLINICAL CONDITION.  Saundra Shelling M.D on 04/25/2018   Between 7am to 6pm - Pager - [phone]  After 6pm go to [email or web address] - password [email or web address] [facility]  Office  [phone]  CC: Primary care physician; [name], MD  Note: This dictation was prepared with Dragon dictation along with smaller phrase technology. Any transcriptional errors that result from this process are unintentional.

## 2018-04-25 NOTE — Progress Notes (Signed)
Subjective: Over the last 24 hours, patient did quite well, she did not require BiPAP, was out of bed to chair yesterday with stable status.  Vitals:   04/25/18 0600 04/25/18 0700 04/25/18 0727 04/25/18 0800  BP: (!) 149/67 (!) 167/79    Pulse: 63 75    Resp: 19 (!) 22    Temp:    98.4 F (36.9 C)  TempSrc:    Oral  SpO2: 97% 93% 90%   Weight:      Height:        Gen: WDWN in NAD HEENT: NCAT, sclerae white, oropharynx normal Neck: No LAN, no JVD noted Lungs: full BS, reduced crackles, no wheezes Cardiovascular: Regular, normal rate, no M noted Abdomen: Soft, NT, +BS Ext: no C/C/E Neuro: PERRL, EOMI, motor/sensory grossly intact Skin: No lesions noted  BMP Latest Ref Rng & Units 04/25/2018 04/24/2018 04/23/2018  Glucose 70 - 99 mg/dL 141(H) 134(H) 154(H)  BUN 8 - 23 mg/dL 51(H) 53(H) 53(H)  Creatinine 0.44 - 1.00 mg/dL 1.06(H) 1.12(H) 1.30(H)  Sodium 135 - 145 mmol/L 142 142 141  Potassium 3.5 - 5.1 mmol/L 4.0 3.3(L) 3.3(L)  Chloride 98 - 111 mmol/L 108 104 104  CO2 22 - 32 mmol/L 26 28 28   Calcium 8.9 - 10.3 mg/dL 8.5(L) 8.4(L) 8.3(L)   CBC Latest Ref Rng & Units 04/24/2018 04/23/2018 04/21/2018  WBC 3.6 - 11.0 K/uL 19.9(H) 18.0(H) 20.4(H)  Hemoglobin 12.0 - 16.0 g/dL 12.8 12.8 12.1  Hematocrit 35.0 - 47.0 % 38.3 38.2 37.0  Platelets 150 - 440 K/uL 299 298 280   CXR: no new film  CT chest reviewed by me  IMPRESSION: Severe hypoxemic respiratory failure, patient tolerated high flow oxygen throughout the day. Presently on 85%, will wean as tolerated, will follow-up chest x-ray today  AKI, nonoliguric. Held off on diuresis last 2 days, BUN and creatinine slowly improving  Leukocytosis. White count is 19.9, on azithromycin and cefepime, completing 5 day course of azithromycin and seven-day course of cefepime  Mild hypokalemia. On replacement  DM 2, Steroid exacerbated hyperglycemia, On sliding so coverage  [name], DO  Patient ID: JAMARA VARY, female    DOB: [birth year], 83 y.o.   MRN: [id number] Patient ID: ELESIA PEMBERTON, female   DOB: [birth year], 83 y.o.   MRN: [id number] Patient ID: FALEN LEHRMANN, female   DOB: 07-06-1935, 83 y.o.   MRN: [id number] Patient ID: SEILA LISTON, female   DOB: Nov 18, 1934, 83 y.o.   MRN: [id number] Patient ID: JERRIE SCHUSSLER, female   DOB: Sep 08, 1935, 83 y.o.   MRN: [id number]

## 2018-04-25 NOTE — Progress Notes (Signed)
Pharmacy Electrolyte Monitoring Consult:  Pharmacy consulted to assist in monitoring and replacing electrolytes in this 83 y.o. female admitted on 04/14/2018 with Shortness of Breath  Labs:  Sodium (mmol/L)  Date Value  04/25/2018 142  01/26/2013 140   Potassium (mmol/L)  Date Value  04/25/2018 4.0  01/26/2013 3.6   Magnesium (mg/dL)  Date Value  04/24/2018 2.5 (H)   Phosphorus (mg/dL)  Date Value  04/24/2018 2.9   Calcium (mg/dL)  Date Value  04/25/2018 8.5 (L)   Calcium, Total (mg/dL)  Date Value  01/26/2013 7.9 (L)   Albumin (g/dL)  Date Value  03/29/2018 3.3 (L)    Assessment/Plan: No supplementation needed. Will recheck BMP with am labs. Goal potassium ~4; goal magnesium ~ 2.   Pharmacy will continue to monitor and adjust per consult.   Paulina Fusi, PharmD, BCPS 04/25/2018 2:03 PM

## 2018-04-26 ENCOUNTER — Inpatient Hospital Stay: Payer: Medicare Other

## 2018-04-26 LAB — BASIC METABOLIC PANEL
ANION GAP: 8 (ref 5–15)
BUN: 52 mg/dL — ABNORMAL HIGH (ref 8–23)
CALCIUM: 8.6 mg/dL — AB (ref 8.9–10.3)
CO2: 28 mmol/L (ref 22–32)
CREATININE: 1.12 mg/dL — AB (ref 0.44–1.00)
Chloride: 105 mmol/L (ref 98–111)
GFR, EST AFRICAN AMERICAN: 52 mL/min — AB (ref >60)
GFR, EST NON AFRICAN AMERICAN: 45 mL/min — AB (ref >60)
GLUCOSE: 153 mg/dL — AB (ref 70–99)
Potassium: 4.2 mmol/L (ref 3.5–5.1)
Sodium: 141 mmol/L (ref 135–145)

## 2018-04-26 LAB — GLUCOSE, CAPILLARY
GLUCOSE-CAPILLARY: 142 mg/dL — AB (ref 70–99)
Glucose-Capillary: 125 mg/dL — ABNORMAL HIGH (ref 70–99)
Glucose-Capillary: 125 mg/dL — ABNORMAL HIGH (ref 70–99)
Glucose-Capillary: 135 mg/dL — ABNORMAL HIGH (ref 70–99)

## 2018-04-26 NOTE — Progress Notes (Signed)
Subjective: Over the last 24 hours, patient did quite well, she did not require BiPAP, was out of bed to chair yesterday with stable status.  Vitals:   04/26/18 0400 04/26/18 0500 04/26/18 0600 04/26/18 0700  BP: (!) 143/70 (!) 148/69 138/68 131/72  Pulse: 70 85 74 83  Resp: 20 (!) 23 (!) 22 18  Temp:      TempSrc:      SpO2: 92% (!) 85% 96% 96%  Weight:      Height:        Gen: WDWN in NAD HEENT: NCAT, sclerae white, oropharynx normal Neck: No LAN, no JVD noted Lungs: full BS, reduced crackles, no wheezes Cardiovascular: Regular, normal rate, no M noted Abdomen: Soft, NT, +BS Ext: no C/C/E Neuro: PERRL, EOMI, motor/sensory grossly intact Skin: No lesions noted  BMP Latest Ref Rng & Units 04/26/2018 04/25/2018 04/24/2018  Glucose 70 - 99 mg/dL 153(H) 141(H) 134(H)  BUN 8 - 23 mg/dL 52(H) 51(H) 53(H)  Creatinine 0.44 - 1.00 mg/dL 1.12(H) 1.06(H) 1.12(H)  Sodium 135 - 145 mmol/L 141 142 142  Potassium 3.5 - 5.1 mmol/L 4.2 4.0 3.3(L)  Chloride 98 - 111 mmol/L 105 108 104  CO2 22 - 32 mmol/L 28 26 28   Calcium 8.9 - 10.3 mg/dL 8.6(L) 8.5(L) 8.4(L)   CBC Latest Ref Rng & Units 04/24/2018 04/23/2018 04/21/2018  WBC 3.6 - 11.0 K/uL 19.9(H) 18.0(H) 20.4(H)  Hemoglobin 12.0 - 16.0 g/dL 12.8 12.8 12.1  Hematocrit 35.0 - 47.0 % 38.3 38.2 37.0  Platelets 150 - 440 K/uL 299 298 280   CXR: reveals improved aeration   IMPRESSION: Severe hypoxemic respiratory failure, patient tolerated high flow oxygen throughout the day. Presently on 75%, will wean as tolerated, will follow-up chest x-ray today  AKI, nonoliguric. Held off on diuresis last 2 days, BUN and creatinine Plateau  Leukocytosis. White count is 19.9, on azithromycin and cefepime, completing 5 day course of azithromycin and seven-day course of cefepime  DM 2, Steroid exacerbated hyperglycemia, On sliding so coverage  [name], DO  Patient ID: LAYLANIE KRUCZEK, female   DOB: July 20, 1935, 83 y.o.   MRN: [id number] Patient ID:  TACORI KVAMME, female   DOB: 1935-03-21, 83 y.o.   MRN: [id number] Patient ID: SHAQUANDA GRAVES, female   DOB: September 14, 1935, 83 y.o.   MRN: [id number] Patient ID: AMEERAH HUFFSTETLER, female   DOB: 1934/11/29, 83 y.o.   MRN: [id number] Patient ID: SOLEDAD BUDREAU, female   DOB: [birth year], 83 y.o.   MRN: [id number] Patient ID: BRISTYN KULESZA, female   DOB: 1935/08/30, 83 y.o.   MRN: [id number]

## 2018-04-26 NOTE — Progress Notes (Signed)
Pharmacy Electrolyte Monitoring Consult:  Pharmacy consulted to assist in monitoring and replacing electrolytes in this 83 y.o. female admitted on 04/03/2018 with Shortness of Breath  Labs:  Sodium (mmol/L)  Date Value  04/26/2018 141  01/26/2013 140   Potassium (mmol/L)  Date Value  04/26/2018 4.2  01/26/2013 3.6   Magnesium (mg/dL)  Date Value  04/24/2018 2.5 (H)   Phosphorus (mg/dL)  Date Value  04/24/2018 2.9   Calcium (mg/dL)  Date Value  04/26/2018 8.6 (L)   Calcium, Total (mg/dL)  Date Value  01/26/2013 7.9 (L)   Albumin (g/dL)  Date Value  04/19/2018 3.3 (L)    Assessment/Plan: No supplementation needed. Will recheck BMP with am labs. Goal potassium ~4; goal magnesium ~ 2.   Pharmacy will continue to monitor and adjust per consult.   Paulina Fusi, PharmD, BCPS 04/26/2018 9:42 AM

## 2018-04-26 NOTE — Progress Notes (Signed)
[place] at [place] NAME: Natasha Bentley    MR#:  [id number]  DATE OF BIRTH:  12-17-1934  SUBJECTIVE:  CHIEF COMPLAINT:   Chief Complaint  Patient presents with  . Shortness of Breath   On 75% HFNC OFF biPAP Patient seen and evaluated today Decreased cough  REVIEW OF SYSTEMS:  CONSTITUTIONAL: Fatigue present EYES: No blurred or double vision.  EARS, NOSE, AND THROAT: No tinnitus or ear pain.  RESPIRATORY: Has  shortness of breath and dry cough.. Chronic orthopnea. CARDIOVASCULAR: No chest pain, orthopnea, edema.  GASTROINTESTINAL: No nausea, vomiting, diarrhea or abdominal pain.  GENITOURINARY: No dysuria, hematuria.  ENDOCRINE: No polyuria, nocturia,  HEMATOLOGY: No anemia, easy bruising or bleeding SKIN: No rash or lesion. MUSCULOSKELETAL: No joint pain or arthritis.   NEUROLOGIC: No tingling, numbness, weakness.  PSYCHIATRY: No anxiety or depression.   ROS  DRUG ALLERGIES:   Allergies  Allergen Reactions  . Etodolac Diarrhea  . Sulfa Antibiotics     VITALS:  Blood pressure 126/64, pulse 95, temperature 97.9 F (36.6 C), temperature source Oral, resp. rate 20, height 5\' 10"  (1.778 m), weight 94.7 kg (208 lb 11.2 oz), SpO2 97 %.  PHYSICAL EXAMINATION:  GENERAL:  83 y.o.-year-old patient lying in the bed.  Looks critically ill on high flow oxygen via nasal cannula EYES: Pupils equal, round, reactive to light and accommodation. No scleral icterus. Extraocular muscles intact.  HEENT: Head atraumatic, normocephalic. Oropharynx and nasopharynx clear.  NECK:  Supple, no jugular venous distention. No thyroid enlargement, no tenderness.  LUNGS: Bilateral coarse breath sounds and wheezing CARDIOVASCULAR: S1, S2 normal. No murmurs, rubs, or gallops.  ABDOMEN: Soft, nontender, nondistended. Bowel sounds present. No organomegaly or mass.  EXTREMITIES: No pedal edema, cyanosis, or clubbing.  NEUROLOGIC: Moves all 4 extremities  equally PSYCHIATRIC: The patient is alert and oriented x 3.  SKIN: No obvious rash, lesion, or ulcer.   Physical Exam LABORATORY PANEL:   CBC Recent Labs  Lab 04/24/18 0433  WBC 19.9*  HGB 12.8  HCT 38.3  PLT 299   ------------------------------------------------------------------------------------------------------------------  Chemistries  Recent Labs  Lab 04/24/18 0433  04/26/18 0429  NA 142   < > 141  K 3.3*   < > 4.2  CL 104   < > 105  CO2 28   < > 28  GLUCOSE 134*   < > 153*  BUN 53*   < > 52*  CREATININE 1.12*   < > 1.12*  CALCIUM 8.4*   < > 8.6*  MG 2.5*  --   --    < > = values in this interval not displayed.   ------------------------------------------------------------------------------------------------------------------  Cardiac Enzymes No results for input(s): TROPONINI in the last 168 hours. ------------------------------------------------------------------------------------------------------------------  RADIOLOGY:  Dg Chest Port 1 View  Result Date: 04/26/2018 CLINICAL DATA:  Hypoxia EXAM: PORTABLE CHEST 1 VIEW COMPARISON:  04/22/2018 FINDINGS: Diffuse interstitial and alveolar opacities throughout the lungs, slightly improved. Probable scarring/fibrosis in the lung bases. Heart is normal size. No effusions or acute bony abnormality. IMPRESSION: Slight improvement in diffuse interstitial and alveolar opacities, likely superimposed on bibasilar scarring/fibrosis. Electronically Signed   By: Rolm Baptise M.D.   On: 04/26/2018 07:24    ASSESSMENT AND PLAN:     83 year old female patient with history of diabetes mellitus, hypertension, hyperlipidemia presented to the emergency room for cough, fever and shortness of breath  -Bilateral pneumonia.  Community-acquired improving. Currently on cefepime abx iv Cultures no growth to  date.   Legionella and pneumococcal antigens are negative   -Acute respiratory failure with persistent hypoxia with slow  improvement On 75% HFNC Wean oxygen as tolerated Nebulization treatments Patient is a partial code  -Hypertension Medications on hold  -Type 2 diabetes mellitus Diabetic diet with sliding scale coverage with insulin  -DVT prophylaxis  Lovenox [place] daily  All the records are reviewed and case discussed with Care Management/Social Workerr. Management plans discussed with the patient, family and they are in agreement.  CODE STATUS: DNR  TOTAL TIME TAKING CARE OF THIS PATIENT: 32 minutes.   POSSIBLE D/C IN 1-2 DAYS, DEPENDING ON CLINICAL CONDITION.  Saundra Shelling M.D on 04/26/2018   Between 7am to 6pm - Pager - [phone]  After 6pm go to [email or web address] - password [email or web address] [facility]  Office  [phone]  CC: Primary care physician; [name], MD  Note: This dictation was prepared with Dragon dictation along with smaller phrase technology. Any transcriptional errors that result from this process are unintentional.

## 2018-04-27 LAB — MAGNESIUM: Magnesium: 2.3 mg/dL (ref 1.7–2.4)

## 2018-04-27 LAB — CBC WITH DIFFERENTIAL/PLATELET
BASOS ABS: 0 10*3/uL (ref 0–0.1)
BASOS PCT: 0 %
EOS ABS: 0 10*3/uL (ref 0–0.7)
EOS PCT: 0 %
HCT: 42.9 % (ref 35.0–47.0)
Hemoglobin: 14.1 g/dL (ref 12.0–16.0)
Lymphocytes Relative: 3 %
Lymphs Abs: 0.9 10*3/uL — ABNORMAL LOW (ref 1.0–3.6)
MCH: 28.3 pg (ref 26.0–34.0)
MCHC: 32.8 g/dL (ref 32.0–36.0)
MCV: 86.3 fL (ref 80.0–100.0)
Monocytes Absolute: 0.2 10*3/uL (ref 0.2–0.9)
Monocytes Relative: 1 %
Neutro Abs: 27.4 10*3/uL — ABNORMAL HIGH (ref 1.4–6.5)
Neutrophils Relative %: 96 %
PLATELETS: 379 10*3/uL (ref 150–440)
RBC: 4.98 MIL/uL (ref 3.80–5.20)
RDW: 15.3 % — ABNORMAL HIGH (ref 11.5–14.5)
WBC: 28.6 10*3/uL — AB (ref 3.6–11.0)

## 2018-04-27 LAB — BASIC METABOLIC PANEL
Anion gap: 10 (ref 5–15)
BUN: 43 mg/dL — ABNORMAL HIGH (ref 8–23)
CALCIUM: 8.8 mg/dL — AB (ref 8.9–10.3)
CHLORIDE: 104 mmol/L (ref 98–111)
CO2: 27 mmol/L (ref 22–32)
CREATININE: 0.98 mg/dL (ref 0.44–1.00)
GFR calc Af Amer: >60 mL/min (ref >60)
GFR calc non Af Amer: 53 mL/min — ABNORMAL LOW (ref >60)
GLUCOSE: 156 mg/dL — AB (ref 70–99)
Potassium: 4.2 mmol/L (ref 3.5–5.1)
Sodium: 141 mmol/L (ref 135–145)

## 2018-04-27 LAB — GLUCOSE, CAPILLARY
GLUCOSE-CAPILLARY: 201 mg/dL — AB (ref 70–99)
GLUCOSE-CAPILLARY: 114 mg/dL — AB (ref 70–99)
Glucose-Capillary: 134 mg/dL — ABNORMAL HIGH (ref 70–99)
Glucose-Capillary: 161 mg/dL — ABNORMAL HIGH (ref 70–99)

## 2018-04-27 MED ORDER — PREDNISONE 20 MG PO TABS
20.0000 mg | ORAL_TABLET | Freq: Every day | ORAL | Status: DC
  Administered 2018-04-27 – 2018-04-28 (×2): 20 mg via ORAL
  Filled 2018-04-27: qty 1

## 2018-04-27 MED ORDER — ENSURE ENLIVE PO LIQD
237.0000 mL | Freq: Two times a day (BID) | ORAL | Status: DC
  Administered 2018-04-27 – 2018-04-28 (×2): 237 mL via ORAL

## 2018-04-27 NOTE — Progress Notes (Signed)
Initial Nutrition Assessment  DOCUMENTATION CODES:   Not applicable  INTERVENTION:   Ensure Enlive po BID, each supplement provides 350 kcal and 20 grams of protein  MVI daily  Liberalize diet   NUTRITION DIAGNOSIS:   Inadequate oral intake related to acute illness as evidenced by per patient/family report.  GOAL:   Patient will meet greater than or equal to 90% of their needs  MONITOR:   PO intake, Supplement acceptance, Labs, Weight trends, Skin, I & O's  REASON FOR ASSESSMENT:   LOS    ASSESSMENT:   83 year old female patient with history of diabetes mellitus, hypertension, hyperlipidemia presented to the emergency room for cough, fever and shortness of breath. Pt found to have bilateral PNA    Met with pt in room today. Pt reports poor appetite and oral intake for 1 week. Pt reports that her appetite is slowly improving. Pt eating 50-100% of meals; reports eating a bowl of grits for breakfast this morning. Pt does not drink supplements at home but is willing to drink chocolate Ensure. Per chart, pt is weight stable. RD will liberalize diet to help pt meet her estimated protein needs.   Medications reviewed and include: aspirin, lovenox, insulin, hydrochlorothiazide, meloxicam, MVI, protonix  Labs reviewed: BUN 43(H), Mg 2.3 wnl P 2.9 wnl- 7/29 Wbc- 28.6(H) cbgs- 125, 125, 142, 135, 134 x 24 hrs  NUTRITION - FOCUSED PHYSICAL EXAM:    Most Recent Value  Orbital Region  No depletion  Upper Arm Region  No depletion  Thoracic and Lumbar Region  No depletion  Buccal Region  No depletion  Temple Region  No depletion  Clavicle Bone Region  No depletion  Clavicle and Acromion Bone Region  No depletion  Scapular Bone Region  No depletion  Dorsal Hand  No depletion  Patellar Region  No depletion  Anterior Thigh Region  No depletion  Posterior Calf Region  No depletion  Edema (RD Assessment)  Mild  Hair  Reviewed  Eyes  Reviewed  Mouth  Reviewed  Skin  Reviewed   Nails  Reviewed     Diet Order:   Diet Order           Diet heart healthy/carb modified Room service appropriate? Yes; Fluid consistency: Thin  Diet effective now         EDUCATION NEEDS:   Education needs have been addressed  Skin:  Skin Assessment: Reviewed RN Assessment(Ecchymosis, MASD)  Last BM:  7/31  Height:   Ht Readings from Last 1 Encounters:  04/23/2018 '5\' 10"'  (1.778 m)    Weight:   Wt Readings from Last 1 Encounters:  04/22/2018 208 lb 11.2 oz (94.7 kg)    Ideal Body Weight:  68.18 kg  BMI:  Body mass index is 29.95 kg/m.  Estimated Nutritional Needs:   Kcal:  1700-2000kcal/day   Protein:  94-104g/day   Fluid:  >1.7L/day   Koleen Distance MS, RD, LDN Pager #- [phone] Office#- [phone] After Hours Pager: [phone]

## 2018-04-27 NOTE — Progress Notes (Signed)
Subjective: Over the last 24 hours, patient did quite well, she did not require BiPAP, was out of bed to chair yesterday with stable status. She has been weaned down to 55% he did high flow which is significant improvement from yesterday. Denies any fever, states she feels well,decreased cough  Vitals:   04/27/18 0300 04/27/18 0400 04/27/18 0500 04/27/18 0600  BP: (!) 156/74 (!) 145/68 (!) 162/70 (!) 159/63  Pulse: 91 90 84 91  Resp: (!) 24 (!) 27 (!) 25 (!) 26  Temp:      TempSrc:      SpO2: 90% 96% 91% 90%  Weight:      Height:        Gen: WDWN in NAD HEENT: NCAT, sclerae white, oropharynx normal Neck: No LAN, no JVD noted Lungs: full BS, reduced crackles, no wheezes Cardiovascular: Regular, normal rate, no M noted Abdomen: Soft, NT, +BS Ext: no C/C/E Neuro: PERRL, EOMI, motor/sensory grossly intact Skin: No lesions noted  BMP Latest Ref Rng & Units 04/27/2018 04/26/2018 04/25/2018  Glucose 70 - 99 mg/dL 156(H) 153(H) 141(H)  BUN 8 - 23 mg/dL 43(H) 52(H) 51(H)  Creatinine 0.44 - 1.00 mg/dL 0.98 1.12(H) 1.06(H)  Sodium 135 - 145 mmol/L 141 141 142  Potassium 3.5 - 5.1 mmol/L 4.2 4.2 4.0  Chloride 98 - 111 mmol/L 104 105 108  CO2 22 - 32 mmol/L 27 28 26   Calcium 8.9 - 10.3 mg/dL 8.8(L) 8.6(L) 8.5(L)   CBC Latest Ref Rng & Units 04/27/2018 04/24/2018 04/23/2018  WBC 3.6 - 11.0 K/uL 28.6(H) 19.9(H) 18.0(H)  Hemoglobin 12.0 - 16.0 g/dL 14.1 12.8 12.8  Hematocrit 35.0 - 47.0 % 42.9 38.3 38.2  Platelets 150 - 440 K/uL 379 299 298   CXR: reveals improved aeration   IMPRESSION: Severe hypoxemic respiratory failure, patient tolerated high flow oxygen throughout the day. Presently on 75%, will wean as tolerated, will follow-up chest x-ray today  AKI, nonoliguric. Held off on diuresis last 2 days, improving renal function  Leukocytosis. Has increased, will follow closely for temperature spikes and change in sputum completing 5 day course of azithromycin and seven-day course of  cefepime  DM 2, Steroid exacerbated hyperglycemia, On sliding so coverage  [name], DO  Patient ID: Natasha Bentley, female   DOB: 1934/12/24, 83 y.o.   MRN: [id number] Patient ID: Natasha Bentley, female   DOB: 04-25-1935, 83 y.o.   MRN: [id number] Patient ID: Natasha Bentley, female   DOB: 12-02-1934, 83 y.o.   MRN: [id number] Patient ID: Natasha Bentley, female   DOB: Oct 04, 1934, 83 y.o.   MRN: [id number] Patient ID: Natasha Bentley, female   DOB: 29-Sep-1934, 83 y.o.   MRN: [id number] Patient ID: Natasha Bentley, female   DOB: 12-30-1934, 83 y.o.   MRN: [id number] Patient ID: Natasha Bentley, female   DOB: 1934-12-01, 83 y.o.   MRN: [id number]

## 2018-04-27 NOTE — Progress Notes (Signed)
[facility] [place] NAME: Natasha Bentley    MR#:  [id number]  DATE OF BIRTH:  [birth year]  SUBJECTIVE:  CHIEF COMPLAINT:   Chief Complaint  Patient presents with  . Shortness of Breath   On 55% HFNC OFF biPAP Patient seen and evaluated today No cough  REVIEW OF SYSTEMS:  CONSTITUTIONAL: Fatigue present EYES: No blurred or double vision.  EARS, NOSE, AND THROAT: No tinnitus or ear pain.  RESPIRATORY: Has  shortness of breath and dry cough.. Chronic orthopnea. CARDIOVASCULAR: No chest pain, orthopnea, edema.  GASTROINTESTINAL: No nausea, vomiting, diarrhea or abdominal pain.  GENITOURINARY: No dysuria, hematuria.  ENDOCRINE: No polyuria, nocturia,  HEMATOLOGY: No anemia, easy bruising or bleeding SKIN: No rash or lesion. MUSCULOSKELETAL: No joint pain or arthritis.   NEUROLOGIC: No tingling, numbness, weakness.  PSYCHIATRY: No anxiety or depression.   ROS  DRUG ALLERGIES:   Allergies  Allergen Reactions  . Etodolac Diarrhea  . Sulfa Antibiotics     VITALS:  Blood pressure (!) 119/58, pulse 95, temperature 97.9 F (36.6 C), temperature source Oral, resp. rate (!) 25, height 5\' 10"  (1.778 m), weight 208 lb 11.2 oz (94.7 kg), SpO2 94 %.  PHYSICAL EXAMINATION:  GENERAL:  83 y.o.-year-old patient lying in the bed.  Looks critically ill on high flow oxygen via nasal cannula EYES: Pupils equal, round, reactive to light and accommodation. No scleral icterus. Extraocular muscles intact.  HEENT: Head atraumatic, normocephalic. Oropharynx and nasopharynx clear.  NECK:  Supple, no jugular venous distention. No thyroid enlargement, no tenderness.  LUNGS: Bilateral coarse breath sounds and decreased wheezing CARDIOVASCULAR: S1, S2 normal. No murmurs, rubs, or gallops.  ABDOMEN: Soft, nontender, nondistended. Bowel sounds present. No organomegaly or mass.  EXTREMITIES: No pedal edema, cyanosis, or clubbing.  NEUROLOGIC: Moves all 4  extremities equally PSYCHIATRIC: The patient is alert and oriented x 3.  SKIN: No obvious rash, lesion, or ulcer.   Physical Exam LABORATORY PANEL:   CBC Recent Labs  Lab 04/27/18 0321  WBC 28.6*  HGB 14.1  HCT 42.9  PLT 379   ------------------------------------------------------------------------------------------------------------------  Chemistries  Recent Labs  Lab 04/27/18 0321  NA 141  K 4.2  CL 104  CO2 27  GLUCOSE 156*  BUN 43*  CREATININE 0.98  CALCIUM 8.8*  MG 2.3   ------------------------------------------------------------------------------------------------------------------  Cardiac Enzymes No results for input(s): TROPONINI in the last 168 hours. ------------------------------------------------------------------------------------------------------------------  RADIOLOGY:  Dg Chest Port 1 View  Result Date: 04/26/2018 CLINICAL DATA:  Hypoxia EXAM: PORTABLE CHEST 1 VIEW COMPARISON:  04/22/2018 FINDINGS: Diffuse interstitial and alveolar opacities throughout the lungs, slightly improved. Probable scarring/fibrosis in the lung bases. Heart is normal size. No effusions or acute bony abnormality. IMPRESSION: Slight improvement in diffuse interstitial and alveolar opacities, likely superimposed on bibasilar scarring/fibrosis. Electronically Signed   By: Rolm Baptise M.D.   On: 04/26/2018 07:24    ASSESSMENT AND PLAN:     83 year old female patient with history of diabetes mellitus, hypertension, hyperlipidemia presented to the emergency room for cough, fever and shortness of breath  -Bilateral pneumonia.  Community-acquired resolving Currently on cefepime abx iv Cultures no growth to date.   Legionella and pneumococcal antigens are negative   -Acute respiratory failure with persistent hypoxia with slow improvement On 55% HFNC Wean oxygen as tolerated Nebulization treatments Patient is a partial code  -Hypertension Medications on hold  -Type  2 diabetes mellitus Diabetic diet with sliding scale coverage with insulin  -DVT prophylaxis  Lovenox [name]  -Physical deconditioning Physical therapy as tolerated  All the records are reviewed and case discussed with Care Management/Social Workerr. Management plans discussed with the patient, family and they are in agreement.  CODE STATUS: DNR  TOTAL TIME TAKING CARE OF THIS PATIENT: 32 minutes.   POSSIBLE D/C IN 1-2 DAYS, DEPENDING ON CLINICAL CONDITION.  Saundra Shelling M.D on 04/27/2018   Between 7am to 6pm - Pager - [phone]  After 6pm go to [email or web address] - password [email or web address] [facility]  Office  [phone]  CC: Primary care physician; [name], MD  Note: This dictation was prepared with Dragon dictation along with smaller phrase technology. Any transcriptional errors that result from this process are unintentional.

## 2018-04-27 NOTE — Progress Notes (Signed)
Pharmacy Electrolyte Monitoring Consult:  Pharmacy consulted to assist in monitoring and replacing electrolytes in this 83 y.o. female admitted on 04/06/2018 with Pneumonia.   Labs:  Sodium (mmol/L)  Date Value  04/27/2018 141  01/26/2013 140   Potassium (mmol/L)  Date Value  04/27/2018 4.2  01/26/2013 3.6   Magnesium (mg/dL)  Date Value  04/27/2018 2.3   Phosphorus (mg/dL)  Date Value  04/24/2018 2.9   Calcium (mg/dL)  Date Value  04/27/2018 8.8 (L)   Calcium, Total (mg/dL)  Date Value  01/26/2013 7.9 (L)   Albumin (g/dL)  Date Value  04/11/2018 3.3 (L)    Assessment/Plan: No supplementation warranted. Will recheck electrolytes with am labs on 8/3.   Goal potassium ~4; goal magnesium ~ 2.   Pharmacy will continue to monitor and adjust per consult.   MLS 04/27/2018 12:24 PM

## 2018-04-27 DEATH — deceased

## 2018-04-28 ENCOUNTER — Inpatient Hospital Stay: Payer: Self-pay

## 2018-04-28 ENCOUNTER — Inpatient Hospital Stay: Payer: Medicare Other

## 2018-04-28 LAB — CBC WITH DIFFERENTIAL/PLATELET
BASOS ABS: 0 10*3/uL (ref 0–0.1)
Basophils Relative: 0 %
EOS PCT: 0 %
Eosinophils Absolute: 0 10*3/uL (ref 0–0.7)
HEMATOCRIT: 38.4 % (ref 35.0–47.0)
Hemoglobin: 12.5 g/dL (ref 12.0–16.0)
LYMPHS PCT: 2 %
Lymphs Abs: 0.7 10*3/uL — ABNORMAL LOW (ref 1.0–3.6)
MCH: 28.2 pg (ref 26.0–34.0)
MCHC: 32.6 g/dL (ref 32.0–36.0)
MCV: 86.3 fL (ref 80.0–100.0)
MONO ABS: 0.1 10*3/uL — AB (ref 0.2–0.9)
MONOS PCT: 0 %
Neutro Abs: 29.5 10*3/uL — ABNORMAL HIGH (ref 1.4–6.5)
Neutrophils Relative %: 98 %
PLATELETS: 356 10*3/uL (ref 150–440)
RBC: 4.45 MIL/uL (ref 3.80–5.20)
RDW: 15.8 % — AB (ref 11.5–14.5)
WBC: 30.3 10*3/uL — ABNORMAL HIGH (ref 3.6–11.0)

## 2018-04-28 LAB — BASIC METABOLIC PANEL
ANION GAP: 7 (ref 5–15)
BUN: 46 mg/dL — ABNORMAL HIGH (ref 8–23)
CHLORIDE: 106 mmol/L (ref 98–111)
CO2: 29 mmol/L (ref 22–32)
Calcium: 8.2 mg/dL — ABNORMAL LOW (ref 8.9–10.3)
Creatinine, Ser: 1.28 mg/dL — ABNORMAL HIGH (ref 0.44–1.00)
GFR calc Af Amer: 44 mL/min — ABNORMAL LOW (ref >60)
GFR calc non Af Amer: 38 mL/min — ABNORMAL LOW (ref >60)
GLUCOSE: 128 mg/dL — AB (ref 70–99)
Potassium: 4.1 mmol/L (ref 3.5–5.1)
Sodium: 142 mmol/L (ref 135–145)

## 2018-04-28 LAB — GLUCOSE, CAPILLARY
GLUCOSE-CAPILLARY: 115 mg/dL — AB (ref 70–99)
Glucose-Capillary: 138 mg/dL — ABNORMAL HIGH (ref 70–99)
Glucose-Capillary: 121 mg/dL — ABNORMAL HIGH (ref 70–99)
Glucose-Capillary: 115 mg/dL — ABNORMAL HIGH (ref 70–99)

## 2018-04-28 LAB — BLOOD GAS, ARTERIAL
Acid-Base Excess: 1.5 mmol/L (ref 0.0–2.0)
Bicarbonate: 24.7 mmol/L (ref 20.0–28.0)
FIO2: 0.56
O2 SAT: 90.7 %
PCO2 ART: 34 mmHg (ref 32.0–48.0)
PH ART: 7.47 — AB (ref 7.350–7.450)
Patient temperature: 37
pO2, Arterial: 56 mmHg — ABNORMAL LOW (ref 83.0–108.0)

## 2018-04-28 MED ORDER — SODIUM CHLORIDE 0.9% FLUSH: 10.0000 mL | INTRAVENOUS | Status: DC | PRN

## 2018-04-28 MED ORDER — INSULIN ASPART 100 UNIT/ML ~~LOC~~ SOLN
SUBCUTANEOUS | Status: AC
  Filled 2018-04-28: qty 1

## 2018-04-28 MED ORDER — VANCOMYCIN HCL 10 G IV SOLR
1250.0000 mg | Freq: Once | INTRAVENOUS | Status: AC
  Administered 2018-04-28: 1250 mg via INTRAVENOUS
  Filled 2018-04-28: qty 1250

## 2018-04-28 MED ORDER — SODIUM CHLORIDE 0.9% FLUSH
10.0000 mL | Freq: Two times a day (BID) | INTRAVENOUS | Status: DC
  Administered 2018-04-28: 10 mL
  Administered 2018-04-28 – 2018-04-29 (×2): 30 mL
  Administered 2018-04-29: 20 mL

## 2018-04-28 MED ORDER — AMPICILLIN-SULBACTAM SODIUM 3 (2-1) G IJ SOLR
3.0000 g | Freq: Four times a day (QID) | INTRAMUSCULAR | Status: DC
  Administered 2018-04-28 – 2018-04-29 (×5): 3 g via INTRAVENOUS
  Filled 2018-04-28 (×9): qty 3

## 2018-04-28 MED ORDER — LORAZEPAM 2 MG/ML IJ SOLN
0.5000 mg | INTRAMUSCULAR | Status: AC
  Administered 2018-04-28: 0.5 mg via INTRAVENOUS

## 2018-04-28 MED ORDER — BUDESONIDE 0.5 MG/2ML IN SUSP
0.5000 mg | Freq: Two times a day (BID) | RESPIRATORY_TRACT | Status: DC
  Administered 2018-04-28 – 2018-04-29 (×2): 0.5 mg via RESPIRATORY_TRACT
  Filled 2018-04-28 (×2): qty 2

## 2018-04-28 MED ORDER — NYSTATIN 100000 UNIT/ML MT SUSP
5.0000 mL | Freq: Four times a day (QID) | OROMUCOSAL | Status: DC
  Administered 2018-04-28 – 2018-04-29 (×3): 500000 [IU] via ORAL
  Filled 2018-04-28 (×8): qty 5

## 2018-04-28 MED ORDER — VANCOMYCIN HCL 10 G IV SOLR
1250.0000 mg | INTRAVENOUS | Status: DC
  Administered 2018-04-28: 1250 mg via INTRAVENOUS
  Filled 2018-04-28 (×2): qty 1250

## 2018-04-28 MED ORDER — IPRATROPIUM-ALBUTEROL 0.5-2.5 (3) MG/3ML IN SOLN
3.0000 mL | RESPIRATORY_TRACT | Status: DC
  Administered 2018-04-28 – 2018-04-29 (×8): 3 mL via RESPIRATORY_TRACT
  Filled 2018-04-28 (×8): qty 3

## 2018-04-28 MED ORDER — FUROSEMIDE 10 MG/ML IJ SOLN
40.0000 mg | INTRAMUSCULAR | Status: AC
  Administered 2018-04-28: 40 mg via INTRAVENOUS

## 2018-04-28 MED ORDER — METHYLPREDNISOLONE SODIUM SUCC 40 MG IJ SOLR
40.0000 mg | Freq: Two times a day (BID) | INTRAMUSCULAR | Status: DC
  Administered 2018-04-28 – 2018-04-29 (×2): 40 mg via INTRAVENOUS
  Filled 2018-04-28: qty 1

## 2018-04-28 MED ORDER — SODIUM CHLORIDE 0.9 % IV BOLUS
500.0000 mL | Freq: Once | INTRAVENOUS | Status: AC
  Administered 2018-04-28: 500 mL via INTRAVENOUS

## 2018-04-28 MED ORDER — LORAZEPAM 2 MG/ML IJ SOLN
INTRAMUSCULAR | Status: AC
  Administered 2018-04-28: 0.5 mg via INTRAVENOUS
  Filled 2018-04-28: qty 1

## 2018-04-28 NOTE — Progress Notes (Signed)
Subjective: Over the last 24 hours, patient was successfully weaned to 15 L nasal cannula. She did however complain of anxiety last p.m. And states she generally doesn't feel well this morning although she is unable to characterize what is wrong. Her oxygen saturation are in the low 90s  Vitals:   04/28/18 0400 04/28/18 0500 04/28/18 0504 04/28/18 0729  BP: (!) 105/53 (!) 123/58  123/69  Pulse: 90 96  93  Resp: (!) 28 (!) 24  (!) 28  Temp:      TempSrc:      SpO2: (!) 87% (!) 84% 91% 94%  Weight:      Height:        Gen: WDWN in NAD HEENT: NCAT, sclerae white, oropharynx normal Neck: No LAN, no JVD noted Lungs: full BS, reduced crackles, no wheezes Cardiovascular: Regular, normal rate, no M noted Abdomen: Soft, NT, +BS Ext: no C/C/E Neuro: PERRL, EOMI, motor/sensory grossly intact Skin: No lesions noted  BMP Latest Ref Rng & Units 04/27/2018 04/26/2018 04/25/2018  Glucose 70 - 99 mg/dL 156(H) 153(H) 141(H)  BUN 8 - 23 mg/dL 43(H) 52(H) 51(H)  Creatinine 0.44 - 1.00 mg/dL 0.98 1.12(H) 1.06(H)  Sodium 135 - 145 mmol/L 141 141 142  Potassium 3.5 - 5.1 mmol/L 4.2 4.2 4.0  Chloride 98 - 111 mmol/L 104 105 108  CO2 22 - 32 mmol/L 27 28 26   Calcium 8.9 - 10.3 mg/dL 8.8(L) 8.6(L) 8.5(L)   CBC Latest Ref Rng & Units 04/27/2018 04/24/2018 04/23/2018  WBC 3.6 - 11.0 K/uL 28.6(H) 19.9(H) 18.0(H)  Hemoglobin 12.0 - 16.0 g/dL 14.1 12.8 12.8  Hematocrit 35.0 - 47.0 % 42.9 38.3 38.2  Platelets 150 - 440 K/uL 379 299 298   CXR: reveals improved aeration   IMPRESSION: Severe hypoxemic respiratory failure, presumptive pneumonia and pulmonary edema in combination,patient tolerated high flow oxygen throughout the day. Presently on 75%, will wean as tolerated, will follow-up chest x-ray today  AKI, nonoliguric. Held off on diuresis, improving renal function. Pending BMP  Leukocytosis. Has increased, will follow closely for temperature spikes and change in sputum completing 5 day course of  azithromycin and seven-day course of cefepime. Will recheck CBC  DM 2, Steroid exacerbated hyperglycemia, On sliding so coverage  [name], DO  Patient ID: Natasha Bentley, female   DOB: [birth year], 83 y.o.   MRN: [id number] Patient ID: [name], female   DOB: 1935-01-06, 83 y.o.   MRN: [id number] Patient ID: [name], female   DOB: Dec 06, 1934, 83 y.o.   MRN: [id number] Patient ID: [name], female   DOB: 11/01/1934, 83 y.o.   MRN: [id number] Patient ID: [name], female   DOB: 1935/01/23, 83 y.o.   MRN: [id number] Patient ID: [name], female   DOB: 11/01/1934, 83 y.o.   MRN: [id number] Patient ID: [name], female   DOB: Sep 26, 1935, 83 y.o.   MRN: [id number] Patient ID: [name], female   DOB: May 30, 1935, 83 y.o.   MRN: [id number]

## 2018-04-28 NOTE — Progress Notes (Signed)
Purewick placed for accurate I&O.  Pt on 100% FiO2 on Bi-PAP.  Spoke with Hinton Dyer, , NP concerning holding PO meds at this time.

## 2018-04-28 NOTE — Progress Notes (Signed)
Pharmacy Electrolyte Monitoring Consult:  Pharmacy consulted to assist in monitoring and replacing electrolytes in this 83 y.o. female admitted on 04/25/2018 with Pneumonia.   Labs:  Sodium (mmol/L)  Date Value  04/28/2018 142  01/26/2013 140   Potassium (mmol/L)  Date Value  04/28/2018 4.1  01/26/2013 3.6   Magnesium (mg/dL)  Date Value  04/27/2018 2.3   Phosphorus (mg/dL)  Date Value  04/24/2018 2.9   Calcium (mg/dL)  Date Value  04/28/2018 8.2 (L)   Calcium, Total (mg/dL)  Date Value  01/26/2013 7.9 (L)   Albumin (g/dL)  Date Value  03/30/2018 3.3 (L)    Assessment/Plan: No supplementation warranted. Will recheck electrolytes with am labs on 8/3.   Goal potassium ~4; goal magnesium ~ 2.   Pharmacy will continue to monitor and adjust per consult.   MLS 04/28/2018 5:19 PM

## 2018-04-28 NOTE — Progress Notes (Signed)
Pharmacy Antibiotic Note  Natasha Bentley is a 83 y.o. female admitted on 03/31/2018 with suspected CAP and severe hypoxemic respiratory failure.  Pharmacy has been consulted for vancomycin and Unasyn dosing. Patient with worsening respiratory status and increase in WBC.  Plan: Unasyn 3g IV Q6hr.   Vancomycin 1250mg  IV x1, followed by vancomycin 1250mg  IV Q24hr for goal trough of 15-20. Will obtain trough prior to 4th dose.   Will monitor renal function daily x 2 days and as needed.   Height: 5\' 10"  (177.8 cm) Weight: 208 lb 11.2 oz (94.7 kg) IBW/kg (Calculated) : 68.5  Temp (24hrs), Avg:97.9 F (36.6 C), Min:97.5 F (36.4 C), Max:98.3 F (36.8 C)  Recent Labs  Lab 04/23/18 0501  04/24/18 0433 04/25/18 0420 04/26/18 0429 04/27/18 0321 04/28/18 1420  WBC 18.0*  --  19.9*  --   --  28.6* 30.3*  CREATININE 1.51*   < > 1.12* 1.06* 1.12* 0.98 1.28*   < > = values in this interval not displayed.    Estimated Creatinine Clearance: 43 mL/min (A) (by C-G formula based on SCr of 1.28 mg/dL (H)).    Allergies  Allergen Reactions  . Etodolac Diarrhea  . Sulfa Antibiotics     Antimicrobials this admission: Azithromycin 7/23, 7/25 >> 7/29 Ceftriaxone 7/24 Vancomycin 7/24 >> 7/25, 8/2 >>  Cefepime 7/24 >>  7/30 Unasyn 8/2 >>   Dose adjustments this admission: 7/28 Cefepime changed to q24hr,.   Microbiology results: 7/23 BCx: no growth x 5 days  7/24 MRSA PCR: negative   Thank you for allowing pharmacy to be a part of this patient's care.  Simpson,Michael L 04/28/2018 5:20 PM

## 2018-04-28 NOTE — Progress Notes (Signed)
Patient initially began on bubble cannula at 15 LPM.  She requested that her son visited, as she was not feeling well and was fearful and anxious.  The x-ray taken this morning showed minimal changes from the pervious x-ray. Son and patient spoke to Dr. Jefferson Fuel, about the possibility of transferring to [facility] this AM, but [facility] was unable to accept.  Lab had issues drawing blood this AM, and a PICC line was eventually placed later in the afternoon.  Patient eventually needed to be placed back on to the high-flow nasal cannula at 95%.  Due to continued dyspnea and work of breathing, the patient was placed on the BiPAP at 100% later this afternoon. Oxygen saturation levels have been WDL since then.

## 2018-04-28 NOTE — Progress Notes (Signed)
[facility] [place] NAME: Natasha Bentley    MR#:  [id number]  DATE OF BIRTH:  11-22-1934  SUBJECTIVE:  CHIEF COMPLAINT:   Chief Complaint  Patient presents with  . Shortness of Breath  And became more anxious last night  Patient states she is not feeling well today  oxygen saturations are in the low 90s Gently on oxygen via high flow nasal cannula at 75% Patient seen and evaluated today Has shortness of breath and occasional cough  REVIEW OF SYSTEMS:  CONSTITUTIONAL: Fatigue present EYES: No blurred or double vision.  EARS, NOSE, AND THROAT: No tinnitus or ear pain.  RESPIRATORY: Has  shortness of breath and  cough.. Chronic orthopnea. CARDIOVASCULAR: No chest pain, orthopnea, edema.  GASTROINTESTINAL: No nausea, vomiting, diarrhea or abdominal pain.  GENITOURINARY: No dysuria, hematuria.  ENDOCRINE: No polyuria, nocturia,  HEMATOLOGY: No anemia, easy bruising or bleeding SKIN: No rash or lesion. MUSCULOSKELETAL: No joint pain or arthritis.   NEUROLOGIC: No tingling, numbness, weakness.  PSYCHIATRY: No anxiety or depression.   ROS  DRUG ALLERGIES:   Allergies  Allergen Reactions  . Etodolac Diarrhea  . Sulfa Antibiotics     VITALS:  Blood pressure 104/84, pulse (!) 106, temperature 98.3 F (36.8 C), temperature source Oral, resp. rate (!) 22, height 5\' 10"  (1.778 m), weight 208 lb 11.2 oz (94.7 kg), SpO2 96 %.  PHYSICAL EXAMINATION:  GENERAL:  83 y.o.-year-old patient lying in the bed.  Looks critically ill on high flow oxygen via nasal cannula EYES: Pupils equal, round, reactive to light and accommodation. No scleral icterus. Extraocular muscles intact.  HEENT: Head atraumatic, normocephalic. Oropharynx and nasopharynx clear.  NECK:  Supple, no jugular venous distention. No thyroid enlargement, no tenderness.  LUNGS: Bilateral coarse breath sounds and decreased wheezing Scattered rales ion both lungs CARDIOVASCULAR:  S1, S2 normal. No murmurs, rubs, or gallops.  ABDOMEN: Soft, nontender, nondistended. Bowel sounds present. No organomegaly or mass.  EXTREMITIES: No pedal edema, cyanosis, or clubbing.  NEUROLOGIC: Moves all 4 extremities equally PSYCHIATRIC: The patient is alert and oriented x 3.  SKIN: No obvious rash, lesion, or ulcer.   Physical Exam LABORATORY PANEL:   CBC Recent Labs  Lab 04/27/18 0321  WBC 28.6*  HGB 14.1  HCT 42.9  PLT 379   ------------------------------------------------------------------------------------------------------------------  Chemistries  Recent Labs  Lab 04/27/18 0321  NA 141  K 4.2  CL 104  CO2 27  GLUCOSE 156*  BUN 43*  CREATININE 0.98  CALCIUM 8.8*  MG 2.3   ------------------------------------------------------------------------------------------------------------------  Cardiac Enzymes No results for input(s): TROPONINI in the last 168 hours. ------------------------------------------------------------------------------------------------------------------  RADIOLOGY:  Dg Chest Port 1 View  Result Date: 04/28/2018 CLINICAL DATA:  Shortness of breath.  Hypoxia. EXAM: PORTABLE CHEST 1 VIEW COMPARISON:  04/26/2018, 03/31/2018, 01/24/2013.  CT 04/19/2018. FINDINGS: Mediastinum and hilar structures are normal. Heart size stable. Diffuse interstitial and alveolar infiltrates. Slight progression from prior exam cannot be excluded. No pleural effusion or pneumothorax. No acute bony abnormality. IMPRESSION: Diffuse interstitial and alveolar infiltrates. Slight progression from prior exam cannot be excluded. Electronically Signed   By: Marcello Moores  Register   On: 04/28/2018 08:33    ASSESSMENT AND PLAN:     83 year old female patient with history of diabetes mellitus, hypertension, hyperlipidemia presented to the emergency room for cough, fever and shortness of breath  -Bilateral pneumonia.  Community-acquired  Currently on cefepime abx iv Cultures  no growth to date.   Legionella and pneumococcal  antigens are negative   -Acute respiratory failure with persistent hypoxia On 75% HFNC Wean oxygen as tolerated Nebulization treatments Patient is a partial code Family wants patient to be transferred to [facility] for further care  -Hypertension Medications on hold  -Type 2 diabetes mellitus Diabetic diet with sliding scale coverage with insulin  -DVT prophylaxis  Lovenox [place] daily  -Physical deconditioning Physical therapy as tolerated  -Transfer to [facility] has been conveyed to ICU attending [place] ICU attending management Family will be updated regarding the transfer plan  All the records are reviewed and case discussed with Care Management/Social Workerr. Management plans discussed with the patient, family and they are in agreement.  CODE STATUS: DNR  TOTAL TIME TAKING CARE OF THIS PATIENT: 32 minutes.   POSSIBLE D/C IN 1-2 DAYS, DEPENDING ON CLINICAL CONDITION.  Saundra Shelling M.D on 04/28/2018   Between 7am to 6pm - Pager - [phone]  After 6pm go to [email or web address] - password [email or web address] [facility]  Office  [phone]  CC: Primary care physician; [name], MD  Note: This dictation was prepared with Dragon dictation along with smaller phrase technology. Any transcriptional errors that result from this process are unintentional.

## 2018-04-28 NOTE — Progress Notes (Signed)
Peripherally Inserted Central Catheter/Midline Placement  The IV Nurse has discussed with the patient and/or persons authorized to consent for the patient, the purpose of this procedure and the potential benefits and risks involved with this procedure.  The benefits include less needle sticks, lab draws from the catheter, and the patient may be discharged home with the catheter. Risks include, but not limited to, infection, bleeding, blood clot (thrombus formation), and puncture of an artery; nerve damage and irregular heartbeat and possibility to perform a PICC exchange if needed/ordered by physician.  Alternatives to this procedure were also discussed.  [employer] PICC patient education guide, fact sheet on infection prevention and patient information card has been provided to patient /or left at bedside.    PICC/Midline Placement Documentation  PICC Double Lumen 09/31/12 PICC Right Basilic 37 cm 0 cm (Active)  Indication for Insertion or Continuance of Line Poor Vasculature-patient has had multiple peripheral attempts or PIVs lasting less than 24 hours 04/28/2018  2:00 PM  Exposed Catheter (cm) 0 cm 04/28/2018  2:00 PM  Site Assessment Clean;Dry;Intact 04/28/2018  2:00 PM  Lumen #1 Status Flushed;Blood return noted 04/28/2018  2:00 PM  Lumen #2 Status Flushed;Blood return noted 04/28/2018  2:00 PM  Dressing Type Transparent 04/28/2018  2:00 PM  Dressing Status Clean;Dry;Intact;Antimicrobial disc in place 04/28/2018  2:00 PM  Dressing Change Due 05/05/18 04/28/2018  2:00 PM       Jule Economy Horton 04/28/2018, 2:15 PM

## 2018-04-28 NOTE — Progress Notes (Signed)
Pt sleeping intermittently with respiratory rate 18-35 bpm.

## 2018-04-28 NOTE — Progress Notes (Signed)
Contacted Dr. Jefferson Fuel regarding low oxygen saturations of 84-87%.Stated that he wanted [place] to increase FiO2 on the HFNC, and if there is no improvement, to place the patient back on the BiPAP.    Cameron Ali, RN

## 2018-04-29 ENCOUNTER — Inpatient Hospital Stay: Payer: Medicare Other

## 2018-04-29 DIAGNOSIS — R0600 Dyspnea, unspecified: Secondary | ICD-10-CM

## 2018-04-29 LAB — BASIC METABOLIC PANEL
Anion gap: 9 (ref 5–15)
BUN: 40 mg/dL — ABNORMAL HIGH (ref 8–23)
CHLORIDE: 106 mmol/L (ref 98–111)
CO2: 29 mmol/L (ref 22–32)
Calcium: 8 mg/dL — ABNORMAL LOW (ref 8.9–10.3)
Creatinine, Ser: 1.2 mg/dL — ABNORMAL HIGH (ref 0.44–1.00)
GFR calc non Af Amer: 41 mL/min — ABNORMAL LOW (ref >60)
GFR, EST AFRICAN AMERICAN: 48 mL/min — AB (ref >60)
Glucose, Bld: 193 mg/dL — ABNORMAL HIGH (ref 70–99)
POTASSIUM: 3.5 mmol/L (ref 3.5–5.1)
SODIUM: 144 mmol/L (ref 135–145)

## 2018-04-29 LAB — CBC
HEMATOCRIT: 36.5 % (ref 35.0–47.0)
Hemoglobin: 12 g/dL (ref 12.0–16.0)
MCH: 28.2 pg (ref 26.0–34.0)
MCHC: 32.8 g/dL (ref 32.0–36.0)
MCV: 86 fL (ref 80.0–100.0)
Platelets: 286 10*3/uL (ref 150–440)
RBC: 4.24 MIL/uL (ref 3.80–5.20)
RDW: 15.3 % — ABNORMAL HIGH (ref 11.5–14.5)
WBC: 30.4 10*3/uL — AB (ref 3.6–11.0)

## 2018-04-29 LAB — GLUCOSE, CAPILLARY
GLUCOSE-CAPILLARY: 168 mg/dL — AB (ref 70–99)
Glucose-Capillary: 168 mg/dL — ABNORMAL HIGH (ref 70–99)

## 2018-04-29 MED ORDER — SALINE SPRAY 0.65 % NA SOLN
1.0000 | NASAL | Status: DC | PRN
  Administered 2018-04-29: 1 via NASAL
  Filled 2018-04-29 (×2): qty 44

## 2018-04-29 MED ORDER — MORPHINE BOLUS VIA INFUSION
1.0000 mg | INTRAVENOUS | Status: DC | PRN
  Administered 2018-04-29 – 2018-04-30 (×3): 1 mg via INTRAVENOUS
  Filled 2018-04-29: qty 2

## 2018-04-29 MED ORDER — FUROSEMIDE 10 MG/ML IJ SOLN
40.0000 mg | Freq: Once | INTRAMUSCULAR | Status: AC
  Administered 2018-04-29: 40 mg via INTRAVENOUS
  Filled 2018-04-29: qty 4

## 2018-04-29 MED ORDER — MORPHINE 100MG IN NS 100ML (1MG/ML) PREMIX INFUSION
1.0000 mg/h | INTRAVENOUS | Status: DC
  Administered 2018-04-29: 1 mg/h via INTRAVENOUS

## 2018-04-29 MED ORDER — POTASSIUM CHLORIDE 10 MEQ/50ML IV SOLN
10.0000 meq | INTRAVENOUS | Status: AC
  Administered 2018-04-29 (×2): 10 meq via INTRAVENOUS
  Filled 2018-04-29 (×2): qty 50

## 2018-04-29 MED ORDER — DILTIAZEM HCL ER COATED BEADS 120 MG PO CP24
120.0000 mg | ORAL_CAPSULE | Freq: Every day | ORAL | Status: DC
  Administered 2018-04-29: 120 mg via ORAL
  Filled 2018-04-29: qty 1

## 2018-04-29 MED ORDER — GLYCOPYRROLATE 0.2 MG/ML IJ SOLN
0.1000 mg | Freq: Once | INTRAMUSCULAR | Status: AC
  Administered 2018-04-29: 0.1 mg via INTRAVENOUS
  Filled 2018-04-29: qty 1

## 2018-04-29 MED ORDER — LORAZEPAM 2 MG/ML IJ SOLN
0.5000 mg | Freq: Once | INTRAMUSCULAR | Status: AC
  Administered 2018-04-29: 0.5 mg via INTRAVENOUS
  Filled 2018-04-29: qty 1

## 2018-04-29 MED ORDER — LORAZEPAM 2 MG/ML IJ SOLN
1.0000 mg | INTRAMUSCULAR | Status: DC | PRN
  Administered 2018-04-29 (×3): 1 mg via INTRAVENOUS
  Filled 2018-04-29 (×4): qty 1

## 2018-04-29 NOTE — Progress Notes (Signed)
[facility] NAME: Natasha Bentley    MR#:  [id number]  DATE OF BIRTH:  1935-03-11  SUBJECTIVE:  CHIEF COMPLAINT:   Chief Complaint  Patient presents with  . Shortness of Breath  feels some better than y'day. BiPAP on, Family at bedside REVIEW OF SYSTEMS:  CONSTITUTIONAL: Fatigue present EYES: No blurred or double vision.  EARS, NOSE, AND THROAT: No tinnitus or ear pain.  RESPIRATORY: Has  shortness of breath and  cough.. Chronic orthopnea. CARDIOVASCULAR: No chest pain, orthopnea, edema.  GASTROINTESTINAL: No nausea, vomiting, diarrhea or abdominal pain.  GENITOURINARY: No dysuria, hematuria.  ENDOCRINE: No polyuria, nocturia,  HEMATOLOGY: No anemia, easy bruising or bleeding SKIN: No rash or lesion. MUSCULOSKELETAL: No joint pain or arthritis.   NEUROLOGIC: No tingling, numbness, weakness.  PSYCHIATRY: No anxiety or depression.   ROS  DRUG ALLERGIES:   Allergies  Allergen Reactions  . Etodolac Diarrhea  . Sulfa Antibiotics     VITALS:  Blood pressure 131/64, pulse 93, temperature 98.8 F (37.1 C), temperature source Axillary, resp. rate (!) 27, height 5\' 10"  (1.778 m), weight 94.7 kg (208 lb 11.2 oz), SpO2 95 %. PHYSICAL EXAMINATION:  GENERAL:  83 y.o.-year-old patient lying in the bed.  Looks critically ill on high flow oxygen via nasal cannula EYES: Pupils equal, round, reactive to light and accommodation. No scleral icterus. Extraocular muscles intact.  HEENT: Head atraumatic, normocephalic. Oropharynx and nasopharynx clear.  NECK:  Supple, no jugular venous distention. No thyroid enlargement, no tenderness.  LUNGS: Bilateral coarse breath sounds and decreased wheezing Scattered rales ion both lungs CARDIOVASCULAR: S1, S2 normal. No murmurs, rubs, or gallops.  ABDOMEN: Soft, nontender, nondistended. Bowel sounds present. No organomegaly or mass.  EXTREMITIES: No pedal edema, cyanosis, or clubbing.  NEUROLOGIC:  Moves all 4 extremities equally PSYCHIATRIC: The patient is alert and oriented x 3.  SKIN: No obvious rash, lesion, or ulcer.   Physical Exam LABORATORY PANEL:   CBC Recent Labs  Lab 04/29/18 0421  WBC 30.4*  HGB 12.0  HCT 36.5  PLT 286   ------------------------------------------------------------------------------------------------------------------  Chemistries  Recent Labs  Lab 04/27/18 0321  04/29/18 0421  NA 141   < > 144  K 4.2   < > 3.5  CL 104   < > 106  CO2 27   < > 29  GLUCOSE 156*   < > 193*  BUN 43*   < > 40*  CREATININE 0.98   < > 1.20*  CALCIUM 8.8*   < > 8.0*  MG 2.3  --   --    < > = values in this interval not displayed.   ------------------------------------------------------------------------------------------------------------------  Cardiac Enzymes No results for input(s): TROPONINI in the last 168 hours. ------------------------------------------------------------------------------------------------------------------  RADIOLOGY:  Dg Chest Port 1 View  Result Date: 04/29/2018 CLINICAL DATA:  Acute respiratory failure. History of CVA, diabetes and hypertension. Former smoker. EXAM: PORTABLE CHEST 1 VIEW COMPARISON:  Chest x-rays dated 04/28/2018, 04/26/2018 in 04/11/2018. FINDINGS: Heart size and mediastinal contours appear stable. RIGHT-sided PICC line is now in place with tip adequately positioned at the level of the mid SVC. There are increased interstitial and alveolar opacities bilaterally, most likely edema superimposed on chronic interstitial lung disease. More confluent/dense opacity at the RIGHT lung base, consistent with asymmetrically prominent pulmonary edema versus additional superimposed pneumonia. No pneumothorax seen. No large pleural effusion. Atherosclerotic changes noted at the aortic arch. Osseous structures about the chest are unremarkable. IMPRESSION: 1.  Increased opacities bilaterally, consistent with pulmonary edema superimposed  on chronic interstitial lung disease. 2. More confluent/dense opacity at the RIGHT lung base, compatible with asymmetrically prominent alveolar pulmonary edema versus additional superimposed pneumonia. 3. RIGHT-sided PICC line appears well positioned with tip at the level of the mid SVC. 4. Aortic atherosclerosis. Electronically Signed   By: Franki Cabot M.D.   On: 04/29/2018 07:55   Dg Chest Port 1 View  Result Date: 04/28/2018 CLINICAL DATA:  Shortness of breath.  Hypoxia. EXAM: PORTABLE CHEST 1 VIEW COMPARISON:  04/26/2018, 04/21/2018, 01/24/2013.  CT 04/19/2018. FINDINGS: Mediastinum and hilar structures are normal. Heart size stable. Diffuse interstitial and alveolar infiltrates. Slight progression from prior exam cannot be excluded. No pleural effusion or pneumothorax. No acute bony abnormality. IMPRESSION: Diffuse interstitial and alveolar infiltrates. Slight progression from prior exam cannot be excluded. Electronically Signed   By: Marcello Moores  Register   On: 04/28/2018 08:33   [place] Ekg Site Rite  Result Date: 04/28/2018 If Site Rite image not attached, placement could not be confirmed due to current cardiac rhythm.  ASSESSMENT AND PLAN:  83 year old female patient with history of diabetes mellitus, hypertension, hyperlipidemia presented to the emergency room for cough, fever and shortness of breath  * Bilateral pneumonia.  Community-acquired  Currently on Unasyn + Vanco abx iv Cultures no growth to date.   Legionella and pneumococcal antigens are negative  * Severe hypoxemic respiratory failure, presumptive pneumonia and pulmonary edema in combination - on BiPAP per PCCM, received another 40 mg IV lasix today  * AKI, nonoliguric: Received Diuretic this am - her creatinine is 0.98 -> 1.2  * DM 2, Steroid exacerbated hyperglycemia, On sliding so coverage  * Hypertension Medications on hold  * Type 2 diabetes mellitus Diabetic diet with sliding scale coverage with insulin  * DVT  prophylaxis  Lovenox [place] daily  * Physical deconditioning Physical therapy as tolerated    All the records are reviewed and case discussed with Care Management/Social Workerr. Management plans discussed with the patient, family and they are in agreement.  CODE STATUS: DNR  TOTAL TIME TAKING CARE OF THIS PATIENT: 15 minutes.   POSSIBLE D/C IN 3-4 DAYS, DEPENDING ON CLINICAL CONDITION.  Max Sane M.D on 04/29/2018   Between 7am to 6pm - Pager - [phone]  After 6pm go to [email or web address] - password [email or web address] [facility]  Office  [phone]  CC: Primary care physician; [name], MD  Note: This dictation was prepared with Dragon dictation along with smaller phrase technology. Any transcriptional errors that result from this process are unintentional.

## 2018-04-29 NOTE — Progress Notes (Signed)
Pharmacy Electrolyte Monitoring Consult:  Pharmacy consulted to assist in monitoring and replacing electrolytes in this 83 y.o. female admitted on 04/26/2018 with Pneumonia.   Labs:  Sodium (mmol/L)  Date Value  04/29/2018 144  01/26/2013 140   Potassium (mmol/L)  Date Value  04/29/2018 3.5  01/26/2013 3.6   Magnesium (mg/dL)  Date Value  04/27/2018 2.3   Phosphorus (mg/dL)  Date Value  04/24/2018 2.9   Calcium (mg/dL)  Date Value  04/29/2018 8.0 (L)   Calcium, Total (mg/dL)  Date Value  01/26/2013 7.9 (L)   Albumin (g/dL)  Date Value  04/04/2018 3.3 (L)    Assessment/Plan: KCL 13mEq IV x 2 doses ordered by provider. No additional supplementation warranted at this time. Will recheck electrolytes with am labs on 8/4.   Goal potassium ~4; goal magnesium ~ 2.   Pharmacy will continue to monitor and adjust per consult.   Pernell Dupre, PharmD, BCPS Clinical Pharmacist 04/29/2018 7:31 AM

## 2018-04-29 NOTE — Progress Notes (Signed)
Patient ID: Natasha Bentley, female   DOB: 12-Nov-1934, 83 y.o.   MRN: [id number] Pulmonary/critical care  Patient still anxious and short of breath. Discussions with patient and son They're ready for transitioning to comfort care. Will start a morphine infusion with as needed Ativan and glycopyrrolate. Will transition to nasal cannula oxygen and DC BiPAP  Shanetra Blumenstock, D.O.

## 2018-04-29 NOTE — Progress Notes (Addendum)
Subjective: Patient had a difficult day yesterday but is improved today. She received Lasix last evening and diuresed 700 mL with improved respiratory status. She was on BiPAP and when Irounded on her this morning she was on heated high flow.  Vitals:   04/29/18 0554 04/29/18 0600 04/29/18 0700 04/29/18 0721  BP:  (!) 146/67 (!) 141/52   Pulse:  98 94   Resp:  (!) 27 (!) 28   Temp:      TempSrc:      SpO2: 90% 91% (!) 89% 92%  Weight:      Height:        Gen: WDWN in NAD HEENT: NCAT, sclerae white, oropharynx normal Neck: No LAN, no JVD noted Lungs: full BS, reduced crackles, no wheezes Cardiovascular: Regular, normal rate, no M noted Abdomen: Soft, NT, +BS Ext: no C/C/E Neuro: PERRL, EOMI, motor/sensory grossly intact Skin: No lesions noted  BMP Latest Ref Rng & Units 04/29/2018 04/28/2018 04/27/2018  Glucose 70 - 99 mg/dL 193(H) 128(H) 156(H)  BUN 8 - 23 mg/dL 40(H) 46(H) 43(H)  Creatinine 0.44 - 1.00 mg/dL 1.20(H) 1.28(H) 0.98  Sodium 135 - 145 mmol/L 144 142 141  Potassium 3.5 - 5.1 mmol/L 3.5 4.1 4.2  Chloride 98 - 111 mmol/L 106 106 104  CO2 22 - 32 mmol/L 29 29 27   Calcium 8.9 - 10.3 mg/dL 8.0(L) 8.2(L) 8.8(L)   CBC Latest Ref Rng & Units 04/29/2018 04/28/2018 04/27/2018  WBC 3.6 - 11.0 K/uL 30.4(H) 30.3(H) 28.6(H)  Hemoglobin 12.0 - 16.0 g/dL 12.0 12.5 14.1  Hematocrit 35.0 - 47.0 % 36.5 38.4 42.9  Platelets 150 - 440 K/uL 286 356 379   CXR: reveals improved aeration   IMPRESSION: Severe hypoxemic respiratory failure, presumptive pneumonia and pulmonary edema in combination,patient's status is improved from yesterday. On heated high flow resting comfortably, use BiPAP last evening. We'll give an additional dose of diuretics today and follow closely  AKI, nonoliguric. Patient tolerated diuretic last evening and repeating again this morning, her creatinine is 1.2  Leukocytosis. Has increased, will follow closely for temperature spikes and change in sputum completing 5 day  course of azithromycin and seven-day course of cefepime, vancomycin and Unasyn added with repeat cultures pending. Chest x-ray revealed a new infiltrate in the right lower lobe lateral segment  DM 2, Steroid exacerbated hyperglycemia, On sliding so coverage  [name], DO  Patient ID: NEVAEHA FINERTY, female   DOB: 16-Nov-1934, 83 y.o.   MRN: [id number] Patient ID: MADISYNN PLAIR, female   DOB: Jan 22, 1935, 83 y.o.   MRN: [id number] Patient ID: LA SHEHAN, female   DOB: 09-Dec-1934, 83 y.o.   MRN: [id number] Patient ID: ZEPPELIN BECKSTRAND, female   DOB: [birth year], 83 y.o.   MRN: [id number] Patient ID: BRIGHTON DELIO, female   DOB: 17-Feb-1935, 83 y.o.   MRN: [id number] Patient ID: TEARIA GIBBS, female   DOB: 1935/08/10, 83 y.o.   MRN: [id number] Patient ID: VALITA RIGHTER, female   DOB: [birth year], 83 y.o.   MRN: [id number] Patient ID: ANDRE GALLEGO, female   DOB: 06/10/1935, 83 y.o.   MRN: [id number] Patient ID: ROSALY LABARBERA, female   DOB: 1935-07-04, 83 y.o.   MRN: [id number]

## 2018-04-29 NOTE — Progress Notes (Signed)
Pt requesting Bi-PAP removed. Spoke with Hinton Dyer, NP.  Will trial.

## 2018-04-29 NOTE — Progress Notes (Signed)
Patient ID: Natasha Bentley, female   DOB: [birth year], 83 y.o.   MRN: [id number] Pulmonary/critical care attending  Long discussion with patient's son. Does not wish her to be intubated if respiratory status were to deteriorate. Reviewed with him all the treatments to include treatment for pneumonia,pulmonary edema, bronchospasm. Patient is extremely anxious and he wishes her to be kept comfortable while still trying to treat her underlying disease. We will give 0.5 mg of Ativan and may repeat every 6 hours as needed. If her status were to deteriorate he would consider transitioning to comfort care. As of right now he does not wish [place] to resume BiPAP unless there is something imminently reversible.  [name], D.O.

## 2018-04-29 NOTE — Progress Notes (Signed)
Pharmacy Antibiotic Note  Natasha Bentley is a 83 y.o. female admitted on 04/02/2018 with suspected CAP and severe hypoxemic respiratory failure.  Pharmacy has been consulted for vancomycin and Unasyn dosing. Patient with worsening respiratory status and increase in WBC.  Plan: Continue Unasyn 3g IV Q6hr.   Continue vancomycin 1250mg  IV Q24hr for goal trough of 15-20. Will obtain trough prior to 4th dose.   Will monitor renal function daily x 2 days and as needed.   Height: 5\' 10"  (177.8 cm) Weight: 208 lb 11.2 oz (94.7 kg) IBW/kg (Calculated) : 68.5  Temp (24hrs), Avg:97.8 F (36.6 C), Min:97.5 F (36.4 C), Max:98.3 F (36.8 C)  Recent Labs  Lab 04/23/18 0501  04/24/18 0433 04/25/18 0420 04/26/18 0429 04/27/18 0321 04/28/18 1420 04/29/18 0421  WBC 18.0*  --  19.9*  --   --  28.6* 30.3* 30.4*  CREATININE 1.51*   < > 1.12* 1.06* 1.12* 0.98 1.28* 1.20*   < > = values in this interval not displayed.    Estimated Creatinine Clearance: 45.9 mL/min (A) (by C-G formula based on SCr of 1.2 mg/dL (H)).    Allergies  Allergen Reactions  . Etodolac Diarrhea  . Sulfa Antibiotics     Antimicrobials this admission: Azithromycin 7/23, 7/25 >> 7/29 Ceftriaxone 7/24 Vancomycin 7/24 >> 7/25, 8/2 >>  Cefepime 7/24 >>  7/30 Unasyn 8/2 >>   Dose adjustments this admission: 7/28 Cefepime changed to q24hr,.   Microbiology results: 7/23 BCx: no growth x 5 days  7/24 MRSA PCR: negative   Thank you for allowing pharmacy to be a part of this patient's care.  Pernell Dupre, PharmD, BCPS Clinical Pharmacist 04/29/2018 7:42 AM

## 2018-04-29 NOTE — Progress Notes (Signed)
Placed pt HFNC PER DANA , BLAKENY   , SAT 90% RR30 HR  99 TOLERATING WELL

## 2018-04-29 NOTE — Progress Notes (Signed)
Pt has remained alert and oriented with c/o pain/irritation to inner nose from HFNC. Pt has remained on HFNC -95%, lung sounds with bilat rhonchi, SpO2 88-91%. Pt has remained in NSR on cardiac monitor with multifocal PVCs.  Pt expresses desire to withdrawal care. Son has remained at bedside and is in agreement with plan to transition to comfort care. Dr Jefferson Fuel has spoke with son on multiple occasions in regards to plan of care. Morphine gtt has been ordered, bu thas not been instated as of yet. Son requested to wait until all family have visited.

## 2018-04-30 DIAGNOSIS — Z515 Encounter for palliative care: Secondary | ICD-10-CM

## 2018-04-30 MED ORDER — LORAZEPAM 2 MG/ML IJ SOLN
2.0000 mg | Freq: Once | INTRAMUSCULAR | Status: AC
  Administered 2018-04-30: 2 mg via INTRAVENOUS

## 2018-05-01 ENCOUNTER — Telehealth: Payer: Self-pay

## 2018-05-01 NOTE — Telephone Encounter (Signed)
Death certificate place in MD folder. NO PROVIDER IN OFFICE UNTIL Friday 05/05/18.

## 2018-05-01 NOTE — Telephone Encounter (Signed)
[employer] dropped off Death Certificate to be completed and signed Placed in nurse box

## 2018-05-01 NOTE — Telephone Encounter (Signed)
Natasha Bentley calling from [employer] calling asking if we can please call back, they just need to know if we will have this done. They are needing to know ASAP. The family would like to do a cremation by Wednesday   Please advise

## 2018-05-02 NOTE — Telephone Encounter (Signed)
[employer] is calling regarding the death certificate. Family would like to have the services for tomorrow. Please call and let know status.

## 2018-05-02 NOTE — Telephone Encounter (Signed)
Death certificate completed. [employer].. Nothing further needed.

## 2018-05-02 NOTE — Telephone Encounter (Signed)
Death cert in your folder to be filled out.

## 2018-05-02 NOTE — Telephone Encounter (Signed)
Patient son called very upset. He states he needs a call within the hour.  He states he was being told by [facility] that no one has called them or gave them an update He states he will come up to [facility] ICU if he has to, he states she passed away due to how the ICU was to patient. Told patient we have called the manager in order to see what else we can do to help He is still upset stating if there is no doctor in office what does this mean for a family in this situation.   They need to do cremation now, in order to have service tomorrow.  Please advise

## 2018-05-28 NOTE — Progress Notes (Signed)
Chaplain responded to request for grief support. Pt's husband, and 2 sons were bedside at death. Chaplain elicit family memories. Chaplain prayed a prayer of comfort and peace. Souse and one son left while other son continued at bedside. Chaplain presented prayer shawl.    May 24, 2018 0500  Clinical Encounter Type  Visited With Patient and family together  Visit Type Initial;Death  Referral From Nurse  Spiritual Encounters  Spiritual Needs Prayer;Grief support

## 2018-05-28 NOTE — Progress Notes (Signed)
Pt passed away at 4:36 AM with family at the bedside.  Pt was on a morphine drip.  When morphine drip was stopped, there was 70cc left in the bag.  There was not a form to fill out to document what was left.  David in pharmacy was made aware.  Donnamae Jude, rn also verified that 70cc was left when drip was discontinued.

## 2018-05-28 NOTE — Death Summary Note (Signed)
DEATH SUMMARY   Patient Details  Name: Natasha Bentley MRN: [id number] DOB: [birth year]  Admission/Discharge Information   Admit Date:  May 11, 2018  Date of Death: Date of Death: 05-23-2018  Time of Death: Time of Death: 0436  Length of Stay: 30-Dec-2022  Referring Physician: [name], MD   Reason(s) for Hospitalization  Respiratory Failure   Diagnoses  Preliminary cause of death: Acute respiratory failure ([place]) Secondary Diagnoses (including complications and co-morbidities):  Active Problems:   Pneumonia   Encounter for palliative care   Severe hypoxemic respiratory failure    Atypical lung infection/pneumonitis   Community acquired pneumonia    Pulmonary Edema    Type II Diabetes Mellitus   Acute renal failure, nonoliguric     Brief Hospital Course (including significant findings, care, treatment, and services provided and events leading to death)  Natasha Bentley is a 83 y.o. year old female who presented to [facility] ER on 2018/05/11 with c/o shortness of breath, generalized weakness, nonproductive cough, and fever temp of 101 F onset of symptoms 3 days prior presentation to the ER. CXR revealed pulmonary edema and BNP 149, pt febrile concerning for possible pneumonia.  She received iv abx and gentle iv fluid hydration.  She was subsequently admitted to the [facility] unit for further workup and treatment.  On 04/19/18 she developed worsening hypoxia CTA Chest revealed possible multifocal pneumonia vs. interstitial pneumonitis with possible interstitial pulmonary edema.  She was transferred to the stepdown unit for continuous Bipap.  She initially showed improvement in respiratory status successfully transitioning to HFNC for several days.  However, her respiratory status declined on 04/28/18 due to severe hypoxia secondary to pulmonary edema and pneumonia requiring intermittent Bipap, iv abx, and iv lasix.  Despite aggressive treatment pt remained hypoxic.  Following ICU Intensivist  discussion with pt and pts son they decided they did not want pt mechanically intubated if respiratory status declined, and the pt did not wish to resume Bipap unless condition imminently reversible.  On the evening of 04/29/2018 the pt became increasingly anxious and short of breath, she was subsequently transitioned to comfort measures only per family request.  She expired with family at bedside on 05-23-18 at 0436 am.  Pertinent Labs and Studies  Significant Diagnostic Studies Dg Chest 2 View  Result Date: May 11, 2018 CLINICAL DATA:  Chest pain and short of breath EXAM: CHEST - 2 VIEW COMPARISON:  01/24/2013 FINDINGS: Hyperinflation. There are small pleural effusions. Diffuse bilateral coarse interstitial opacity with patchy peripheral ground-glass opacity in the bilateral right greater than left lung bases. Normal heart size. No pneumothorax. IMPRESSION: 1. Small pleural effusions 2. Diffuse increased interstitial opacity with mild peripheral ground-glass opacity in the right greater than left lung bases. Suspect that there may be some component of underlying interstitial lung disease. Findings suspected to represent acute inflammatory process or edema on underlying chronic change. Electronically Signed   By: Donavan Foil M.D.   On: 11-May-2018 18:32   Ct Angio Chest Pe W Or Wo Contrast  Result Date: 04/19/2018 CLINICAL DATA:  Shortness of breath. Hypoxia. Clinical concern for pulmonary embolism. EXAM: CT ANGIOGRAPHY CHEST WITH CONTRAST TECHNIQUE: Multidetector CT imaging of the chest was performed using the standard protocol during bolus administration of intravenous contrast. Multiplanar CT image reconstructions and MIPs were obtained to evaluate the vascular anatomy. CONTRAST:  11mL ISOVUE-370 IOPAMIDOL (ISOVUE-370) INJECTION 76% COMPARISON:  Chest radiographs obtained yesterday. Chest CT dated 08/17/2005. FINDINGS: Cardiovascular: Atheromatous calcifications, including the coronary arteries and aorta.  Normally opacified pulmonary arteries with no pulmonary arterial filling defects. Mediastinum/Nodes: Interval mildly enlarged mediastinal and bilateral hilar lymph nodes. These include a left anterior superior mediastinal node with a short axis diameter of 17 mm on image number 19 series 4, right paratracheal node with a short axis diameter of 11 mm on image number 32 series 4, right hilar node with a short axis diameter of 12 mm on image number 46 series for and left hilar node with a short axis diameter of 11 mm on image number 45 series 4. No enlarged axillary or upper abdominal nodes. 9 mm left lobe thyroid nodule.  Small hiatal hernia. Lungs/Pleura: Small bilateral pleural effusions. Mild bilateral dependent atelectasis. Extensive patchy interstitial prominence in both lungs, greater on the right. There are multiple small to moderate areas of more confluent opacity in the right middle lobe. These are rounded and oval in configuration. Upper Abdomen: Unremarkable. Musculoskeletal: Thoracic and lower cervical spine degenerative changes. Review of the MIP images confirms the above findings. IMPRESSION: 1. Multiple rounded and oval confluent opacities in the right middle lobe, suspicious for multifocal pneumonia. A follow-up chest CT is recommended in 3 months to exclude an underlying mass. 2. Patchy interstitial prominence in both lungs. This is compatible with interstitial pneumonitis with a possible element of interstitial pulmonary edema. 3. Small bilateral pleural effusions with mild bilateral dependent atelectasis. 4. Mild mediastinal and bilateral hilar adenopathy. This is most likely reactive. Metastatic adenopathy is less likely. 5. No pulmonary emboli seen. 6. Small hiatal hernia. 7. 9 mm left lobe thyroid nodule, too small to characterize, but most likely benign in the absence of known clinical risk factors for thyroid carcinoma. 8.  Calcific coronary artery and aortic atherosclerosis. Aortic  Atherosclerosis (ICD10-I70.0). Electronically Signed   By: Claudie Revering M.D.   On: 04/19/2018 16:11   Mr Lumbar Spine Wo Contrast  Result Date: 04/14/2018 CLINICAL DATA:  Bilateral leg pain over the last several years, worsening recently. EXAM: MRI LUMBAR SPINE WITHOUT CONTRAST TECHNIQUE: Multiplanar, multisequence MR imaging of the lumbar spine was performed. No intravenous contrast was administered. COMPARISON:  None. FINDINGS: Segmentation:  5 lumbar type vertebral bodies. Alignment: Mild curvature convex to the right. 2 mm retrolisthesis L2-3. Vertebrae:  No fracture or primary bone lesion. Conus medullaris and cauda equina: Conus extends to the T12-L1 level. Conus and cauda equina appear normal. Paraspinal and other soft tissues: Negative Disc levels: Minimal, non-compressive disc bulges at T11-12, T12-L1 and L1-2. L2-3: Retrolisthesis of 2 mm. Endplate osteophytes and bulging of the disc more towards the left. Facet degeneration and hypertrophy. Mild narrowing of the canal and foramina but without demonstrable neural compression. L3-4: Bulging of the disc. Bilateral facet degeneration and hypertrophy with joint effusions. Mild canal narrowing but without apparent neural compression. L4-5: Mild bulging of the disc. Bilateral facet degeneration. No stenosis. L5-S1: Chronic disc degeneration with loss of disc height. Endplate osteophytes and bulging of the disc more towards the left. Facet degeneration. Mild narrowing of the subarticular lateral recess on the left but without distinct neural compression. IMPRESSION: Chronic appearing degenerative changes of the lumbar spine as outlined above. The findings could certainly relate to back pain, but I do not see any likely neural compressive stenosis. See above discussion. Electronically Signed   By: Nelson Chimes M.D.   On: 04/14/2018 13:11   Dg Chest Port 1 View  Result Date: 04/29/2018 CLINICAL DATA:  Acute respiratory failure. History of CVA, diabetes and  hypertension. Former smoker. EXAM: PORTABLE  CHEST 1 VIEW COMPARISON:  Chest x-rays dated 04/28/2018, 04/26/2018 in 04/08/2018. FINDINGS: Heart size and mediastinal contours appear stable. RIGHT-sided PICC line is now in place with tip adequately positioned at the level of the mid SVC. There are increased interstitial and alveolar opacities bilaterally, most likely edema superimposed on chronic interstitial lung disease. More confluent/dense opacity at the RIGHT lung base, consistent with asymmetrically prominent pulmonary edema versus additional superimposed pneumonia. No pneumothorax seen. No large pleural effusion. Atherosclerotic changes noted at the aortic arch. Osseous structures about the chest are unremarkable. IMPRESSION: 1. Increased opacities bilaterally, consistent with pulmonary edema superimposed on chronic interstitial lung disease. 2. More confluent/dense opacity at the RIGHT lung base, compatible with asymmetrically prominent alveolar pulmonary edema versus additional superimposed pneumonia. 3. RIGHT-sided PICC line appears well positioned with tip at the level of the mid SVC. 4. Aortic atherosclerosis. Electronically Signed   By: Franki Cabot M.D.   On: 04/29/2018 07:55   Dg Chest Port 1 View  Result Date: 04/28/2018 CLINICAL DATA:  Shortness of breath.  Hypoxia. EXAM: PORTABLE CHEST 1 VIEW COMPARISON:  04/26/2018, 04/17/2018, 01/24/2013.  CT 04/19/2018. FINDINGS: Mediastinum and hilar structures are normal. Heart size stable. Diffuse interstitial and alveolar infiltrates. Slight progression from prior exam cannot be excluded. No pleural effusion or pneumothorax. No acute bony abnormality. IMPRESSION: Diffuse interstitial and alveolar infiltrates. Slight progression from prior exam cannot be excluded. Electronically Signed   By: Marcello Moores  Register   On: 04/28/2018 08:33   Dg Chest Port 1 View  Result Date: 04/26/2018 CLINICAL DATA:  Hypoxia EXAM: PORTABLE CHEST 1 VIEW COMPARISON:  04/22/2018  FINDINGS: Diffuse interstitial and alveolar opacities throughout the lungs, slightly improved. Probable scarring/fibrosis in the lung bases. Heart is normal size. No effusions or acute bony abnormality. IMPRESSION: Slight improvement in diffuse interstitial and alveolar opacities, likely superimposed on bibasilar scarring/fibrosis. Electronically Signed   By: Rolm Baptise M.D.   On: 04/26/2018 07:24   Dg Chest Port 1 View  Result Date: 04/22/2018 CLINICAL DATA:  Pneumonia EXAM: PORTABLE CHEST 1 VIEW COMPARISON:  04/21/2018 FINDINGS: Decreased bilateral airspace opacities/edema noted. Cardiomediastinal silhouette is unchanged. Low volume film with mild bibasilar opacities/atelectasis noted. No pneumothorax or acute bony abnormality. IMPRESSION: Decreased bilateral airspace opacities/edema. No other significant change. Electronically Signed   By: Margarette Canada M.D.   On: 04/22/2018 13:28   Dg Chest Port 1 View  Result Date: 04/21/2018 CLINICAL DATA:  Respiratory failure EXAM: PORTABLE CHEST 1 VIEW COMPARISON:  04/13/2018 FINDINGS: Shallow inspiration. Increasing diffuse bilateral airspace and interstitial disease. This may represent edema, multifocal pneumonia, or ARDS. No definite effusions. No pneumothorax. Heart size is obscured by parenchymal process. Calcification of the aorta. IMPRESSION: Increasing bilateral airspace and interstitial disease in the lungs. Electronically Signed   By: Lucienne Capers M.D.   On: 04/21/2018 01:28   [place] Ekg Site Rite  Result Date: 04/28/2018 If Site Rite image not attached, placement could not be confirmed due to current cardiac rhythm.  [place] Ekg Site Rite  Result Date: 04/22/2018 If Site Rite image not attached, placement could not be confirmed due to current cardiac rhythm.   Microbiology No results found for this or any previous visit (from the past 240 hour(s)).  Lab Basic Metabolic Panel: Recent Labs  Lab 04/24/18 0433 04/25/18 0420 04/26/18 0429  04/27/18 0321 04/28/18 1420 04/29/18 0421  NA 142 142 141 141 142 144  K 3.3* 4.0 4.2 4.2 4.1 3.5  CL 104 108 105 104 106 106  CO2  28 26 28 27 29 29   GLUCOSE 134* 141* 153* 156* 128* 193*  BUN 53* 51* 52* 43* 46* 40*  CREATININE 1.12* 1.06* 1.12* 0.98 1.28* 1.20*  CALCIUM 8.4* 8.5* 8.6* 8.8* 8.2* 8.0*  MG 2.5*  --   --  2.3  --   --   PHOS 2.9  --   --   --   --   --    Liver Function Tests: No results for input(s): AST, ALT, ALKPHOS, BILITOT, PROT, ALBUMIN in the last 168 hours. No results for input(s): LIPASE, AMYLASE in the last 168 hours. No results for input(s): AMMONIA in the last 168 hours. CBC: Recent Labs  Lab 04/24/18 0433 04/27/18 0321 04/28/18 1420 04/29/18 0421  WBC 19.9* 28.6* 30.3* 30.4*  NEUTROABS  --  27.4* 29.5*  --   HGB 12.8 14.1 12.5 12.0  HCT 38.3 42.9 38.4 36.5  MCV 85.7 86.3 86.3 86.0  PLT 299 379 356 286   Cardiac Enzymes: No results for input(s): CKTOTAL, CKMB, CKMBINDEX, TROPONINI in the last 168 hours. Sepsis Labs: Recent Labs  Lab 04/24/18 0433 04/27/18 0321 04/28/18 1420 04/29/18 0421  WBC 19.9* 28.6* 30.3* 30.4*    Procedures/Operations  Right Upper Arm PICC Placement   Marda Stalker, [place] Pager [phone] (please enter 7 digits) PCCM Consult Pager [phone] (please enter 7 digits)

## 2018-05-28 DEATH — deceased

## 2019-11-05 IMAGING — CT CT ANGIO CHEST
2 of 6 series · 17 of 46 positions shown · IV contrast (APPLIED)
Comparison: Chest radiographs obtained yesterday. Chest CT dated
08/17/2005.

CLINICAL DATA: Shortness of breath. Hypoxia. Clinical concern for
pulmonary embolism.

EXAM:
CT ANGIOGRAPHY CHEST WITH CONTRAST
TECHNIQUE: Multidetector CT imaging of the chest was performed using the
standard protocol during bolus administration of intravenous
contrast. Multiplanar CT image reconstructions and MIPs were
obtained to evaluate the vascular anatomy.
CONTRAST:  75mL AQQL8V-RMG IOPAMIDOL (AQQL8V-RMG) INJECTION 76%

[Series 5: thins · axial · 0.62mm/px · z∈[-308,-54]mm · 14 of 280 slices shown]
[im 13/280  lung]
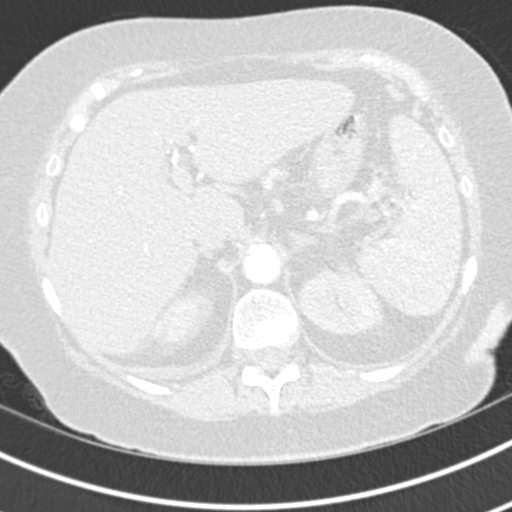
[im 37/280  soft-tissue]
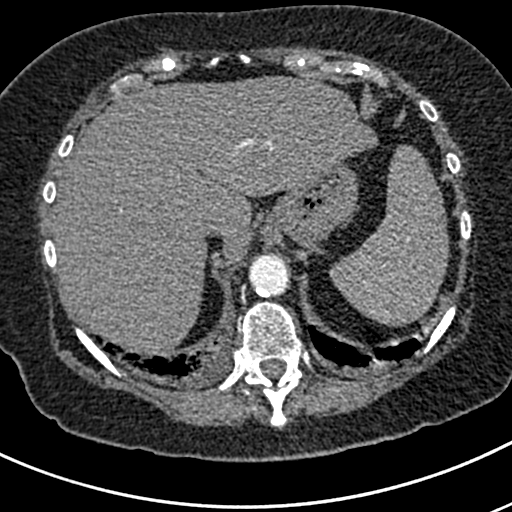
[im 49/280  lung]
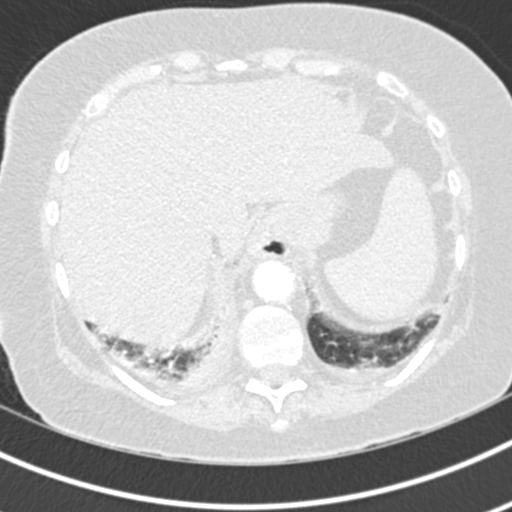
[im 73/280  soft-tissue]
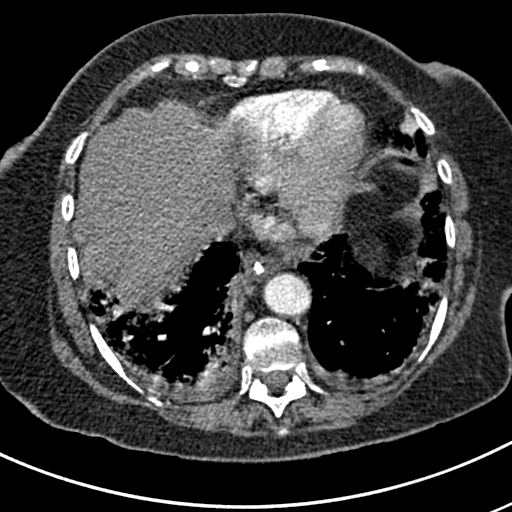
[im 98/280  lung]
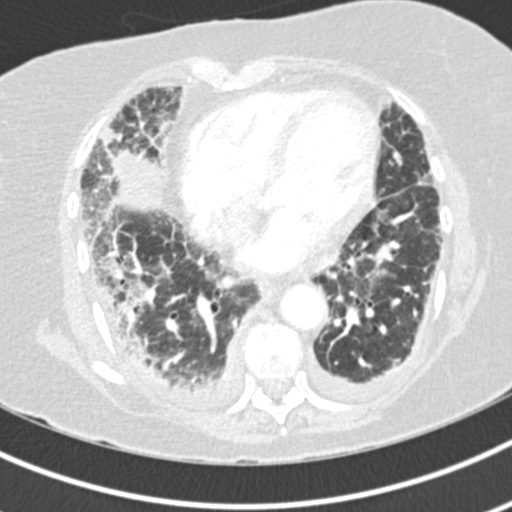
[im 110/280  soft-tissue]
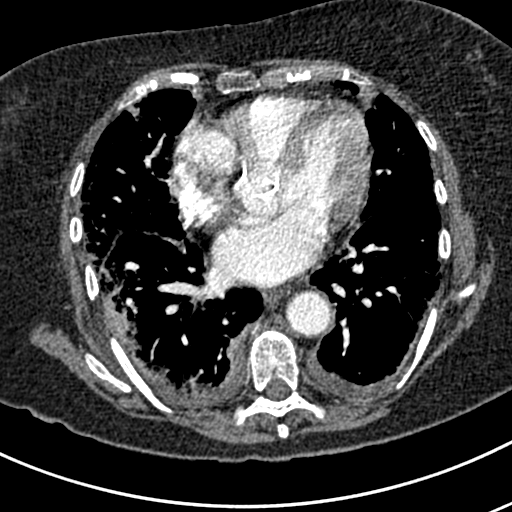
[im 134/280  lung]
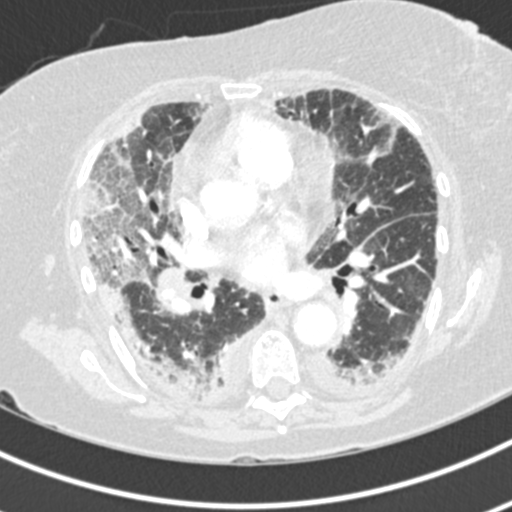
[im 146/280  soft-tissue]
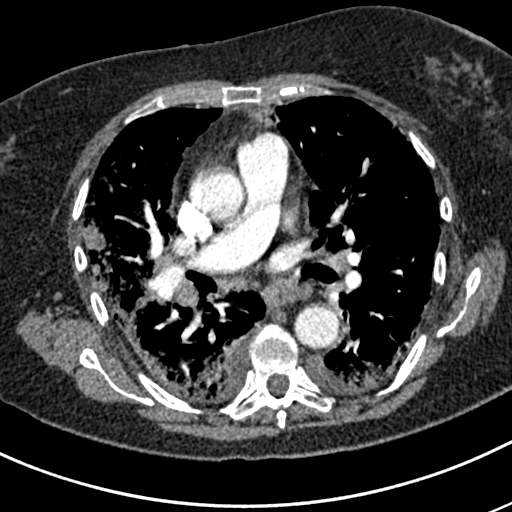
[im 170/280  lung]
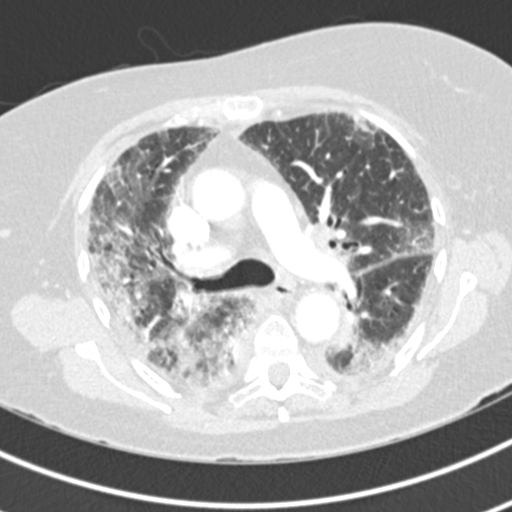
[im 182/280  soft-tissue]
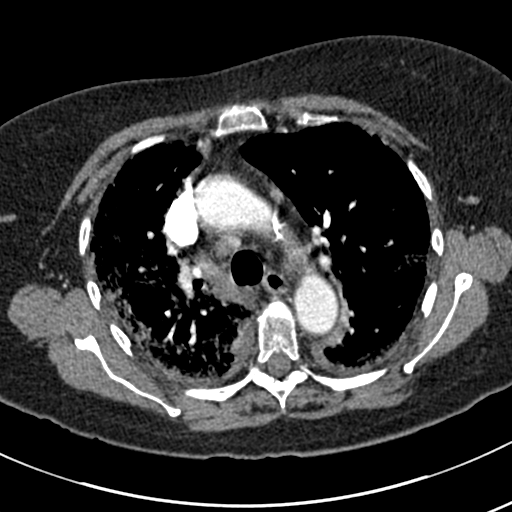
[im 207/280  lung]
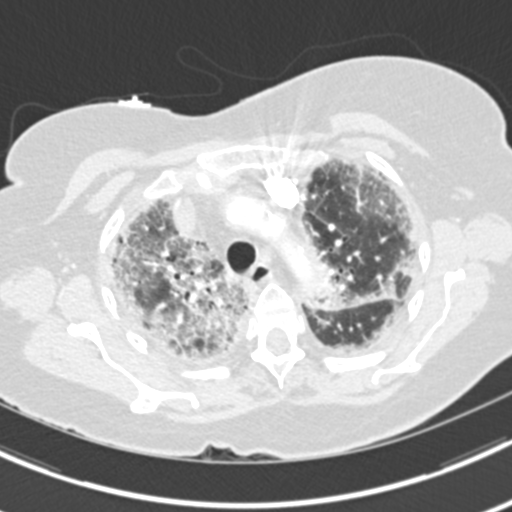
[im 231/280  soft-tissue]
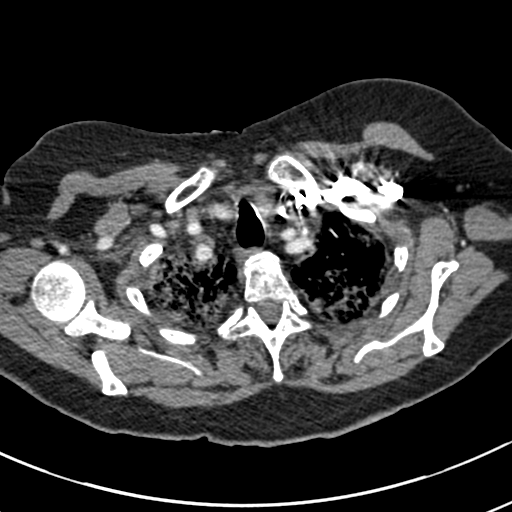
[im 243/280  lung]
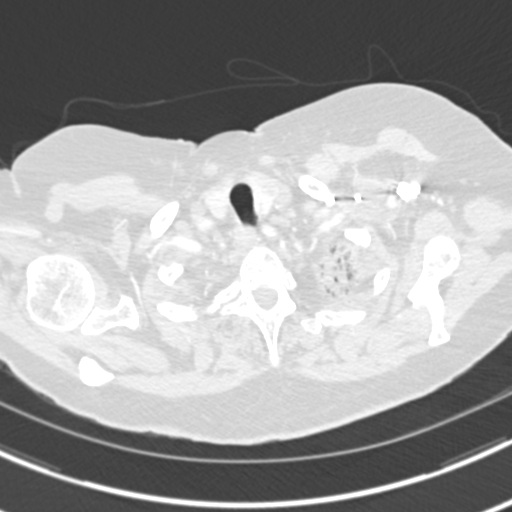
[im 267/280  soft-tissue]
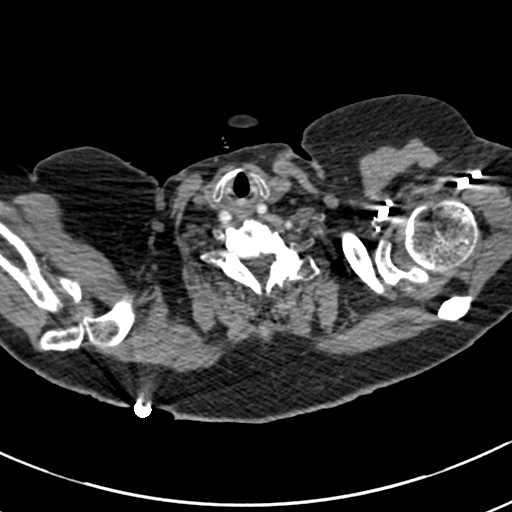

[Series 7: coronal mpr · coronal · 0.54mm/px · 3 of 88 slices shown]
[im 22/88  soft-tissue]
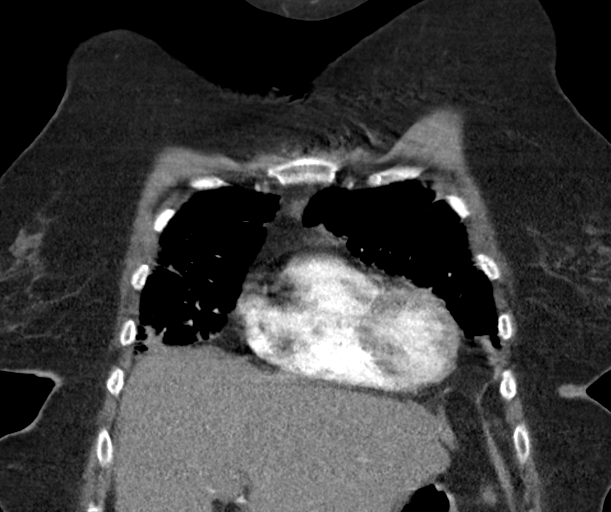
[im 44/88  soft-tissue]
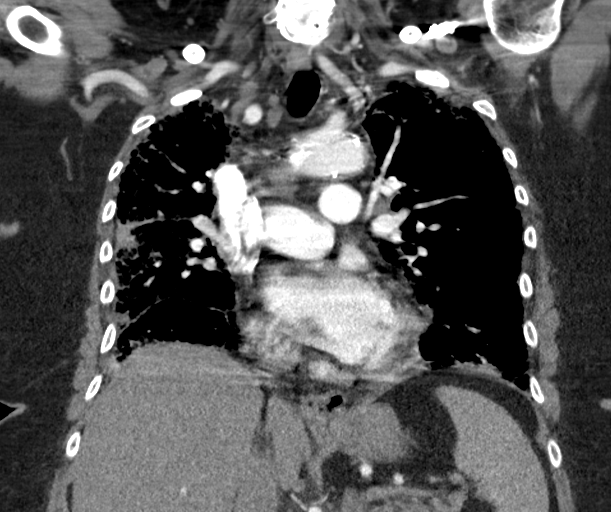
[im 66/88  soft-tissue]
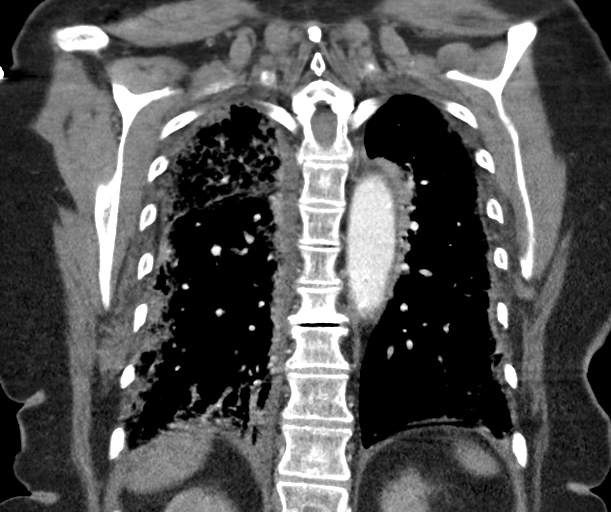

[17 of 46 positions shown; findings below may reference images not displayed]

FINDINGS: Cardiovascular: Atheromatous calcifications, including the coronary
arteries and aorta. Normally opacified pulmonary arteries with no
pulmonary arterial filling defects.

Mediastinum/Nodes: Interval mildly enlarged mediastinal and
bilateral hilar lymph nodes. These include a left anterior superior
mediastinal node with a short axis diameter of 17 mm on image number
19 series 4, right paratracheal node with a short axis diameter of
11 mm on image number 32 series 4, right hilar node with a short
axis diameter of 12 mm on image number 46 series for and left hilar
node with a short axis diameter of 11 mm on image number 45 series
4. No enlarged axillary or upper abdominal nodes.

9 mm left lobe thyroid nodule.  Small hiatal hernia.

Lungs/Pleura: Small bilateral pleural effusions. Mild bilateral
dependent atelectasis. Extensive patchy interstitial prominence in
both lungs, greater on the right. There are multiple small to
moderate areas of more confluent opacity in the right middle lobe.
These are rounded and oval in configuration.

Upper Abdomen: Unremarkable.

Musculoskeletal: Thoracic and lower cervical spine degenerative
changes.

Review of the MIP images confirms the above findings.
IMPRESSION: 1. Multiple rounded and oval confluent opacities in the right middle
lobe, suspicious for multifocal pneumonia. A follow-up chest CT is
recommended in 3 months to exclude an underlying mass.
2. Patchy interstitial prominence in both lungs. This is compatible
with interstitial pneumonitis with a possible element of
interstitial pulmonary edema.
3. Small bilateral pleural effusions with mild bilateral dependent
atelectasis.
4. Mild mediastinal and bilateral hilar adenopathy. This is most
likely reactive. Metastatic adenopathy is less likely.
5. No pulmonary emboli seen.
6. Small hiatal hernia.
7. 9 mm left lobe thyroid nodule, too small to characterize, but
most likely benign in the absence of known clinical risk factors for
thyroid carcinoma.
8.  Calcific coronary artery and aortic atherosclerosis.

Aortic Atherosclerosis ([id number]).
